# Patient Record
Sex: Male | Born: 1955 | Race: White | Hispanic: No | Marital: Married | Smoking: Never smoker
Health system: Southern US, Community
[De-identification: ages and names within clinical notes are randomized; demographics above are authoritative.]

## PROBLEM LIST (undated history)

## (undated) DIAGNOSIS — K589 Irritable bowel syndrome without diarrhea: Secondary | ICD-10-CM

## (undated) DIAGNOSIS — K8020 Calculus of gallbladder without cholecystitis without obstruction: Secondary | ICD-10-CM

## (undated) DIAGNOSIS — K5790 Diverticulosis of intestine, part unspecified, without perforation or abscess without bleeding: Secondary | ICD-10-CM

## (undated) DIAGNOSIS — I499 Cardiac arrhythmia, unspecified: Secondary | ICD-10-CM

## (undated) DIAGNOSIS — E782 Mixed hyperlipidemia: Secondary | ICD-10-CM

## (undated) DIAGNOSIS — E1169 Type 2 diabetes mellitus with other specified complication: Secondary | ICD-10-CM

## (undated) DIAGNOSIS — R197 Diarrhea, unspecified: Secondary | ICD-10-CM

## (undated) DIAGNOSIS — I471 Supraventricular tachycardia, unspecified: Secondary | ICD-10-CM

## (undated) DIAGNOSIS — N529 Male erectile dysfunction, unspecified: Secondary | ICD-10-CM

## (undated) DIAGNOSIS — F411 Generalized anxiety disorder: Secondary | ICD-10-CM

## (undated) DIAGNOSIS — K760 Fatty (change of) liver, not elsewhere classified: Secondary | ICD-10-CM

## (undated) DIAGNOSIS — G4700 Insomnia, unspecified: Secondary | ICD-10-CM

## (undated) DIAGNOSIS — T7840XA Allergy, unspecified, initial encounter: Secondary | ICD-10-CM

## (undated) DIAGNOSIS — H9319 Tinnitus, unspecified ear: Secondary | ICD-10-CM

## (undated) DIAGNOSIS — G4733 Obstructive sleep apnea (adult) (pediatric): Secondary | ICD-10-CM

## (undated) DIAGNOSIS — K2990 Gastroduodenitis, unspecified, without bleeding: Secondary | ICD-10-CM

## (undated) DIAGNOSIS — J301 Allergic rhinitis due to pollen: Secondary | ICD-10-CM

## (undated) DIAGNOSIS — E119 Type 2 diabetes mellitus without complications: Secondary | ICD-10-CM

## (undated) DIAGNOSIS — K449 Diaphragmatic hernia without obstruction or gangrene: Secondary | ICD-10-CM

## (undated) DIAGNOSIS — K219 Gastro-esophageal reflux disease without esophagitis: Secondary | ICD-10-CM

## (undated) DIAGNOSIS — Z8719 Personal history of other diseases of the digestive system: Secondary | ICD-10-CM

## (undated) DIAGNOSIS — M1990 Unspecified osteoarthritis, unspecified site: Secondary | ICD-10-CM

## (undated) DIAGNOSIS — K2970 Gastritis, unspecified, without bleeding: Secondary | ICD-10-CM

## (undated) DIAGNOSIS — K2270 Barrett's esophagus without dysplasia: Secondary | ICD-10-CM

## (undated) HISTORY — DX: Insomnia, unspecified: G47.00

## (undated) HISTORY — DX: Diaphragmatic hernia without obstruction or gangrene: K44.9

## (undated) HISTORY — DX: Allergy, unspecified, initial encounter: T78.40XA

## (undated) HISTORY — DX: Type 2 diabetes mellitus with other specified complication: E11.69

## (undated) HISTORY — DX: Obstructive sleep apnea (adult) (pediatric): G47.33

## (undated) HISTORY — DX: Supraventricular tachycardia: I47.1

## (undated) HISTORY — DX: Mixed hyperlipidemia: E78.2

## (undated) HISTORY — DX: Diverticulosis of intestine, part unspecified, without perforation or abscess without bleeding: K57.90

## (undated) HISTORY — DX: Fatty (change of) liver, not elsewhere classified: K76.0

## (undated) HISTORY — DX: Tinnitus, unspecified ear: H93.19

## (undated) HISTORY — PX: HERNIA REPAIR: SHX51

## (undated) HISTORY — PX: CARPAL TUNNEL RELEASE: SHX101

## (undated) HISTORY — DX: Diarrhea, unspecified: R19.7

## (undated) HISTORY — PX: UPPER GASTROINTESTINAL ENDOSCOPY: SHX188

## (undated) HISTORY — DX: Type 2 diabetes mellitus without complications: E11.9

## (undated) HISTORY — DX: Gastro-esophageal reflux disease without esophagitis: K21.9

## (undated) HISTORY — DX: Barrett's esophagus without dysplasia: K22.70

## (undated) HISTORY — PX: THORACIC DISC SURGERY: SHX801

## (undated) HISTORY — PX: ELBOW SURGERY: SHX618

## (undated) HISTORY — DX: Gastritis, unspecified, without bleeding: K29.70

## (undated) HISTORY — DX: Gastroduodenitis, unspecified, without bleeding: K29.90

## (undated) HISTORY — DX: Generalized anxiety disorder: F41.1

## (undated) HISTORY — DX: Allergic rhinitis due to pollen: J30.1

## (undated) HISTORY — DX: Male erectile dysfunction, unspecified: N52.9

## (undated) HISTORY — DX: Calculus of gallbladder without cholecystitis without obstruction: K80.20

## (undated) HISTORY — DX: Supraventricular tachycardia, unspecified: I47.10

## (undated) HISTORY — DX: Irritable bowel syndrome, unspecified: K58.9

---

## 1998-01-10 ENCOUNTER — Other Ambulatory Visit: Admission: RE | Admit: 1998-01-10 | Discharge: 1998-01-10 | Payer: Self-pay | Admitting: Gastroenterology

## 1999-05-09 ENCOUNTER — Ambulatory Visit: Admission: RE | Admit: 1999-05-09 | Discharge: 1999-05-09 | Payer: Self-pay | Admitting: Family Medicine

## 2000-01-11 ENCOUNTER — Ambulatory Visit (HOSPITAL_COMMUNITY): Admission: RE | Admit: 2000-01-11 | Discharge: 2000-01-11 | Payer: Self-pay | Admitting: Family Medicine

## 2000-01-11 ENCOUNTER — Encounter: Payer: Self-pay | Admitting: Family Medicine

## 2000-01-18 ENCOUNTER — Encounter (INDEPENDENT_AMBULATORY_CARE_PROVIDER_SITE_OTHER): Payer: Self-pay | Admitting: Specialist

## 2000-01-18 ENCOUNTER — Other Ambulatory Visit: Admission: RE | Admit: 2000-01-18 | Discharge: 2000-01-18 | Payer: Self-pay | Admitting: Gastroenterology

## 2000-08-19 ENCOUNTER — Encounter: Payer: Self-pay | Admitting: Family Medicine

## 2000-08-19 ENCOUNTER — Encounter: Admission: RE | Admit: 2000-08-19 | Discharge: 2000-08-19 | Payer: Self-pay | Admitting: Family Medicine

## 2005-01-23 ENCOUNTER — Other Ambulatory Visit: Payer: Self-pay

## 2005-01-23 ENCOUNTER — Emergency Department: Payer: Self-pay | Admitting: Emergency Medicine

## 2006-08-23 ENCOUNTER — Ambulatory Visit (HOSPITAL_COMMUNITY): Admission: RE | Admit: 2006-08-23 | Discharge: 2006-08-23 | Payer: Self-pay | Admitting: Cardiology

## 2007-07-11 ENCOUNTER — Ambulatory Visit: Payer: Self-pay | Admitting: Gastroenterology

## 2007-07-11 LAB — CONVERTED CEMR LAB
Eosinophils Absolute: 0.1 10*3/uL (ref 0.0–0.7)
Eosinophils Relative: 2 % (ref 0.0–5.0)
Folate: >20 ng/mL
Iron: 65 ug/dL (ref 42–165)
MCV: 87.8 fL (ref 78.0–100.0)
Neutrophils Relative %: 64.4 % (ref 43.0–77.0)
Platelets: 240 10*3/uL (ref 150–400)
RDW: 13.2 % (ref 11.5–14.6)
Sed Rate: 5 mm/hr (ref 0–16)
Vitamin B-12: 482 pg/mL (ref 211–911)
WBC: 7.3 10*3/uL (ref 4.5–10.5)

## 2007-07-12 ENCOUNTER — Encounter: Payer: Self-pay | Admitting: Gastroenterology

## 2007-07-19 ENCOUNTER — Ambulatory Visit: Payer: Self-pay | Admitting: Gastroenterology

## 2007-08-17 ENCOUNTER — Ambulatory Visit: Payer: Self-pay | Admitting: Gastroenterology

## 2007-08-17 DIAGNOSIS — K2270 Barrett's esophagus without dysplasia: Secondary | ICD-10-CM

## 2007-08-17 DIAGNOSIS — K5730 Diverticulosis of large intestine without perforation or abscess without bleeding: Secondary | ICD-10-CM | POA: Insufficient documentation

## 2007-08-17 DIAGNOSIS — K589 Irritable bowel syndrome without diarrhea: Secondary | ICD-10-CM | POA: Insufficient documentation

## 2010-03-02 DIAGNOSIS — K219 Gastro-esophageal reflux disease without esophagitis: Secondary | ICD-10-CM

## 2010-03-02 DIAGNOSIS — E119 Type 2 diabetes mellitus without complications: Secondary | ICD-10-CM | POA: Insufficient documentation

## 2010-03-03 ENCOUNTER — Ambulatory Visit: Payer: Self-pay | Admitting: Gastroenterology

## 2010-03-03 LAB — CONVERTED CEMR LAB
Albumin: 4.1 g/dL (ref 3.5–5.2)
BUN: 20 mg/dL (ref 6–23)
Basophils Relative: 0.2 % (ref 0.0–3.0)
Creatinine, Ser: 1 mg/dL (ref 0.4–1.5)
Eosinophils Absolute: 0.1 10*3/uL (ref 0.0–0.7)
Folate: 15.7 ng/mL
GFR calc non Af Amer: 78.94 mL/min (ref >60)
Glucose, Bld: 120 mg/dL — ABNORMAL HIGH (ref 70–99)
HCT: 45.3 % (ref 39.0–52.0)
Hemoglobin: 15.6 g/dL (ref 13.0–17.0)
IgA: 197 mg/dL (ref 68–378)
Iron: 91 ug/dL (ref 42–165)
Lipase: 57 units/L (ref 11.0–59.0)
Lymphs Abs: 2.4 10*3/uL (ref 0.7–4.0)
MCHC: 34.3 g/dL (ref 30.0–36.0)
MCV: 89.2 fL (ref 78.0–100.0)
Monocytes Absolute: 0.9 10*3/uL (ref 0.1–1.0)
Neutro Abs: 5.2 10*3/uL (ref 1.4–7.7)
Neutrophils Relative %: 60.6 % (ref 43.0–77.0)
RBC: 5.08 M/uL (ref 4.22–5.81)
Sed Rate: 4 mm/hr (ref 0–22)
TSH: 1.18 microintl units/mL (ref 0.35–5.50)
Tissue Transglutaminase Ab, IgA: 4.2 units (ref <20)
Total Bilirubin: 1 mg/dL (ref 0.3–1.2)
Vitamin B-12: 486 pg/mL (ref 211–911)

## 2010-03-09 ENCOUNTER — Encounter: Payer: Self-pay | Admitting: Gastroenterology

## 2010-03-24 ENCOUNTER — Ambulatory Visit: Payer: Self-pay | Admitting: Gastroenterology

## 2010-03-24 ENCOUNTER — Encounter (INDEPENDENT_AMBULATORY_CARE_PROVIDER_SITE_OTHER): Payer: Self-pay | Admitting: *Deleted

## 2010-03-24 DIAGNOSIS — I10 Essential (primary) hypertension: Secondary | ICD-10-CM

## 2010-03-24 DIAGNOSIS — K2990 Gastroduodenitis, unspecified, without bleeding: Secondary | ICD-10-CM

## 2010-03-24 DIAGNOSIS — R197 Diarrhea, unspecified: Secondary | ICD-10-CM | POA: Insufficient documentation

## 2010-03-24 DIAGNOSIS — K2970 Gastritis, unspecified, without bleeding: Secondary | ICD-10-CM | POA: Insufficient documentation

## 2010-03-24 DIAGNOSIS — K449 Diaphragmatic hernia without obstruction or gangrene: Secondary | ICD-10-CM | POA: Insufficient documentation

## 2010-03-24 DIAGNOSIS — E785 Hyperlipidemia, unspecified: Secondary | ICD-10-CM

## 2010-04-27 ENCOUNTER — Ambulatory Visit
Admission: RE | Admit: 2010-04-27 | Discharge: 2010-04-27 | Payer: Self-pay | Source: Home / Self Care | Attending: Gastroenterology | Admitting: Gastroenterology

## 2010-04-27 ENCOUNTER — Other Ambulatory Visit: Payer: Self-pay | Admitting: Gastroenterology

## 2010-04-28 LAB — HELICOBACTER PYLORI SCREEN-BIOPSY: UREASE: NEGATIVE

## 2010-04-29 LAB — GLUCOSE, CAPILLARY
Glucose-Capillary: 141 mg/dL — ABNORMAL HIGH (ref 70–99)
Glucose-Capillary: 128 mg/dL — ABNORMAL HIGH (ref 70–99)

## 2010-05-05 ENCOUNTER — Encounter: Payer: Self-pay | Admitting: Gastroenterology

## 2010-05-14 NOTE — Letter (Signed)
Summary: Diabetic Instructions  [facility]  [address] [place], [place] [address]   Phone: [phone]  Fax: [phone]    DAETON KLUTH 09-07-1955 MRN: [id number]   X   ORAL DIABETIC MEDICATION INSTRUCTIONS  The day before your procedure:   Take your diabetic pill as you do normally  The day of your procedure:   Do not take your diabetic pill    We will check your blood sugar levels during the admission process and again in Recovery before discharging you home

## 2010-05-14 NOTE — Letter (Signed)
Summary: [facility] Instructions  [facility]  [address] [place], [place] [address]   Phone: [phone]  Fax: [phone]       Alexander Chambers    06-01-1955    MRN: [id number]        Procedure Day /Date: Monday 04/27/2010     Arrival Time: 1pm     Procedure Time: 2pm     Location of Procedure:                    X  [facility] (4th Floor)   PREPARATION FOR COLONOSCOPY WITH MOVIPREP   Starting 5 days prior to your procedure 04/22/2010 do not eat nuts, seeds, popcorn, corn, beans, peas,  salads, or any raw vegetables.  Do not take any fiber supplements (e.g. Metamucil, Citrucel, and Benefiber).  THE DAY BEFORE YOUR PROCEDURE         Sunday 04/26/2010  1.  Drink clear liquids the entire day-NO SOLID FOOD  2.  Do not drink anything colored red or purple.  Avoid juices with pulp.  No orange juice.  3.  Drink at least 64 oz. (8 glasses) of fluid/clear liquids during the day to prevent dehydration and help the prep work efficiently.  CLEAR LIQUIDS INCLUDE: Water Jello Ice Popsicles Tea (sugar ok, no milk/cream) Powdered fruit flavored drinks Coffee (sugar ok, no milk/cream) Gatorade Juice: apple, white grape, white cranberry  Lemonade Clear bullion, consomm, broth Carbonated beverages (any kind) Strained chicken noodle soup Hard Candy                             4.  In the morning, mix first dose of MoviPrep solution:    Empty 1 Pouch A and 1 Pouch B into the disposable container    Add lukewarm drinking water to the top line of the container. Mix to dissolve    Refrigerate (mixed solution should be used within 24 hrs)  5.  Begin drinking the prep at 5:00 p.m. The MoviPrep container is divided by 4 marks.   Every 15 minutes drink the solution down to the next mark (approximately 8 oz) until the full liter is complete.   6.  Follow completed prep with 16 oz of clear liquid of your choice (Nothing red or purple).  Continue to drink clear  liquids until bedtime.  7.  Before going to bed, mix second dose of MoviPrep solution:    Empty 1 Pouch A and 1 Pouch B into the disposable container    Add lukewarm drinking water to the top line of the container. Mix to dissolve    Refrigerate  THE DAY OF YOUR PROCEDURE      Monday 04/27/2010  Beginning at 9:00am (5 hours before procedure):         1. Every 15 minutes, drink the solution down to the next mark (approx 8 oz) until the full liter is complete.  2. Follow completed prep with 16 oz. of clear liquid of your choice.    3. You may drink clear liquids until 12 (noon) (2 HOURS BEFORE PROCEDURE).   MEDICATION INSTRUCTIONS  Unless otherwise instructed, you should take regular prescription medications with a small sip of water   as early as possible the morning of your procedure.  Diabetic patients - see separate instructions.          OTHER INSTRUCTIONS  You will need a responsible adult at least 55  years of age to accompany you and drive you home.   This person must remain in the waiting room during your procedure.  Wear loose fitting clothing that is easily removed.  Leave jewelry and other valuables at home.  However, you may wish to bring a book to read or  an iPod/MP3 player to listen to music as you wait for your procedure to start.  Remove all body piercing jewelry and leave at home.  Total time from sign-in until discharge is approximately 2-3 hours.  You should go home directly after your procedure and rest.  You can resume normal activities the  day after your procedure.  The day of your procedure you should not:   Drive   Make legal decisions   Operate machinery   Drink alcohol   Return to work  You will receive specific instructions about eating, activities and medications before you leave.    The above instructions have been reviewed and explained to me by   _______________________    I fully understand and can verbalize these  instructions _____________________________ Date _________

## 2010-05-14 NOTE — Miscellaneous (Signed)
Summary: Orders Update  Clinical Lists Changes  Orders: Added new Test order of TLB-H Pylori Screen Gastric Biopsy (83013-CLOTEST) - Signed 

## 2010-05-14 NOTE — Assessment & Plan Note (Signed)
Summary: 3 WEEK FU /JMS   History of Present Illness Visit Type: Follow-up Visit Primary GI MD: Sheryn Bison MD FACP FAGA Primary Provider: Lupita Raider, MD  Requesting Provider: na Chief Complaint: F/u visit for IBS flare. Pt states that he is still having diarrhea  History of Present Illness:   Alexander Chambers continues with crampy lower abdominal pain, diarrhea, and no rectal bleeding. Treatment with Xifaxan and probiotics has not alleviated his 5-6 soft stools a day. His acid reflux symptoms are improved on omeprazole. He has had little improvement with Levsin, and apparently does not usually appear mild. His diabetes and hypertension are under good control.  He denies infectious disease exposure or sick family members at home, severe stress, other new medications, or other systemic complaints. All lab data was normal including sedimentation rate. As per before, he is status post fundoplication surgery.   GI Review of Systems      Denies abdominal pain, acid reflux, belching, bloating, chest pain, dysphagia with liquids, dysphagia with solids, heartburn, loss of appetite, nausea, vomiting, vomiting blood, weight loss, and  weight gain.      Reports diarrhea and  irritable bowel syndrome.     Denies anal fissure, black tarry stools, change in bowel habit, constipation, diverticulosis, fecal incontinence, heme positive stool, hemorrhoids, jaundice, light color stool, liver problems, rectal bleeding, and  rectal pain.    Current Medications (verified): 1)  Lisinopril 5 Mg Tabs (Lisinopril) .... Take 1 Tablet By Mouth Once Daily 2)  Metformin Hcl 500 Mg Tabs (Metformin Hcl) .... Take 1 Tablet  Three Times A Day 3)  Glipizide 10 Mg Tabs (Glipizide) .... Take 1 Tablet By Mouth Two Times A Day 4)  Allergy Relief 4 Mg Tabs (Chlorpheniramine Maleate) .... Take 1 Tablet By Mouth Once Daily 5)  Januvia 100 Mg Tabs (Sitagliptin Phosphate) .... Take 1 Tablet By Mouth Once Daily 6)  Vytorin 10-10 Mg  Tabs (Ezetimibe-Simvastatin) .... Once Daily 7)  Aspirin 81 Mg Tbec (Aspirin) .... Once Daily 8)  Omeprazole 20 Mg Cpdr (Omeprazole) .... Take One By Mouth Once Daily 9)  Levsin 0.125 Mg Tabs (Hyoscyamine Sulfate) .... Take One By Mouth Two Times A Day As Needed 10)  Anti-Diarrheal 2 Mg Tabs (Loperamide Hcl) .... As Needed  Allergies (verified): No Known Drug Allergies  Past History:  Past medical, surgical, family and social histories (including risk factors) reviewed for relevance to current acute and chronic problems.  Past Medical History: DIARRHEA (ICD-787.91) HYPERTENSION (ICD-401.9) HYPERLIPIDEMIA (ICD-272.4) HIATAL HERNIA (ICD-553.3) GASTRITIS (ICD-535.50) GERD (ICD-530.81) DM (ICD-250.00) DIVERTICULOSIS-COLON (ICD-562.10) IRRITABLE BOWEL SYNDROME (ICD-564.1) BARRETT'S ESOPHAGUS (ICD-530.85)  Past Surgical History: Reviewed history from 03/03/2010 and no changes required. cyst removed from right elbow Hernia Surgery  Family History: Reviewed history from 03/02/2010 and no changes required. No FH of Colon Cancer: Family History of Diabetes:  Family History of Heart Disease: father  Social History: Reviewed history from 03/02/2010 and no changes required. CKS Package Married Patient has never smoked.  Alcohol Use - yes socially Illicit Drug Use - no  Review of Systems       The patient complains of allergy/sinus, fatigue, and sleeping problems.  The patient denies anemia, anxiety-new, arthritis/joint pain, back pain, blood in urine, breast changes/lumps, change in vision, confusion, cough, coughing up blood, depression-new, fainting, fever, headaches-new, hearing problems, heart murmur, heart rhythm changes, itching, menstrual pain, muscle pains/cramps, night sweats, nosebleeds, pregnancy symptoms, shortness of breath, skin rash, sore throat, swelling of feet/legs, swollen lymph glands, thirst - excessive ,  urination - excessive , urination changes/pain, urine  leakage, vision changes, and voice change.    Vital Signs:  Patient profile:   55 year old male Height:      69 inches Weight:      182 pounds BMI:     26.97 BSA:     1.99 Pulse rate:   92 / minute Pulse rhythm:   regular BP sitting:   128 / 74  (left arm) Cuff size:   regular  Vitals Entered By: Ok Anis CMA (March 24, 2010 3:17 PM)  Physical Exam  General:  Well developed, well nourished, no acute distress.healthy appearing.   Head:  Normocephalic and atraumatic. Eyes:  PERRLA, no icterus.exam deferred to patient's ophthalmologist.   Lungs:  Clear throughout to auscultation. Heart:  Regular rate and rhythm; no murmurs, rubs,  or bruits. Abdomen:  Soft, nontender and nondistended. No masses, hepatosplenomegaly or hernias noted. Normal bowel sounds.Mild tenderness over a mobile sigmoid colon in the left lower quadrant area. There is no rebound tenderness. Rectal:  Normal exam.hemocult negative.   Prostate:  .normal size prostate.   Msk:  Symmetrical with no gross deformities. Normal posture. Extremities:  No clubbing, cyanosis, edema or deformities noted. Neurologic:  Alert and  oriented x4;  grossly normal neurologically.   Impression & Recommendations:  Problem # 1:  DIARRHEA (ICD-787.91) Assessment Unchanged Consider inflammatory bowel disease with his sigmoid colon tenderness, cramping, and new onset diarrhea. He had no benefit for treatment for bacterial overgrowth syndrome with Xifaxan and probiotics. Surprisingly, all of his labs were normal. I have placed him on Asacol 800 mg twice a day, Librax t.i.d., and will continue his omeprazole. We also placed him on a low fiber diet as tolerated. Outpatient endoscopy and colonoscopy have been scheduled as soon as possible. His symptomatology seemed disproportionate to objective findings suggesting possible functional problems.Will obtain small bowel biopsies the time of endoscopy to exclude celiac  disease. Orders: Colon/Endo (Colon/Endo)  Problem # 2:  GERD (ICD-530.81) Assessment: Improved Continue omeprazole 20 mg a day as tolerated. Orders: Colon/Endo (Colon/Endo)  Problem # 3:  DM (ICD-250.00) Assessment: Unchanged continue other medications per Dr. Clelia Croft.  Patient Instructions: 1)  Copy sent to : Lupita Raider, MD  2)  Your prescription(s) have been sent to you pharmacy.  3)  Stop your Levsin and start Librax. 4)  [facility] Patient Information Guide given to patient.  5)  Advised to stick with a low residue diet  avoiding food that can irritate bowel (see handout).  6)  Colonoscopy and Flexible Sigmoidoscopy brochure given.  7)  Upper Endoscopy brochure given.  8)  Your procedure has been scheduled for 04/27/2010, please follow the seperate instructions.  9)  The medication list was reviewed and reconciled.  All changed / newly prescribed medications were explained.  A complete medication list was provided to the patient / caregiver. Prescriptions: MOVIPREP 100 GM  SOLR (PEG-KCL-NACL-NASULF-NA ASC-C) As per prep instructions.  #1 x 0   Entered by:   Harlow Mares CMA (AAMA)   Authorized by:   Mardella Layman MD [facility]   Signed by:   Harlow Mares CMA (AAMA) on 03/24/2010   Method used:   Electronically to        [facility] [phone]* (retail)       [address]       [place], [place]  [address]  Ph: [phone] or [phone]       Fax: [phone]   RxID:   [id number] ASACOL HD 800 MG TBEC (MESALAMINE) take two tabs by mouth two times a day  #120 x 3   Entered by:   Harlow Mares CMA (AAMA)   Authorized by:   Mardella Layman MD [facility]   Signed by:   Harlow Mares CMA (AAMA) on 03/24/2010   Method used:   Electronically to        [facility] [phone]* (retail)       [address] [place], [place]  [address]       Ph: [phone] or [phone]       Fax: [phone]   RxID:    [id number] LIBRAX 2.5-5 MG CAPS (CLIDINIUM-CHLORDIAZEPOXIDE) take one by mouth three times a day before meals.  #90 x 3   Entered by:   Harlow Mares CMA (AAMA)   Authorized by:   Mardella Layman MD [facility]   Signed by:   Harlow Mares CMA (AAMA) on 03/24/2010   Method used:   Electronically to        [facility] [phone]* (retail)       [address] [place], [place]  [address]       Ph: [phone] or [phone]       Fax: [phone]   RxID:   [id number]

## 2010-05-14 NOTE — Procedures (Signed)
Summary: Colonoscopy   Colonoscopy  Procedure date:  07/20/2007  Findings:      Location:  [facility].    Colonoscopy  Procedure date:  07/20/2007  Findings:      [facility]:   [facility]   Patient Name: Alexander Chambers, Alexander Chambers MRN: [id number] Procedure Procedures: Colonoscopy CPT: 81191.  Personnel: Endoscopist: Vania Rea. Jarold Motto, MD.  Referred By: Desma Maxim, MD.  Exam Location: Exam performed in Outpatient Clinic. Outpatient  Patient Consent: Procedure, Alternatives, Risks and Benefits discussed, consent obtained, from patient. Consent was obtained by the RN.  Indications Symptoms: Diarrhea Patient's stools are liquid. Abdominal pain / bloating.  History  Current Medications: Patient is not currently taking Coumadin.  Pre-Exam Physical: Performed Jul 19, 2007. Entire physical exam was normal.  Comments: Pt. history reviewed/updated, physical exam performed prior to initiation of sedation? YES Exam Exam: Extent of exam reached: Terminal Ileum, extent intended: Terminal Ileum.  The cecum was identified by appendiceal orifice and IC valve. Patient position: on left side. Time to Cecum: 00:02:25. Time for Withdrawl: 00:05. Colon retroflexion performed. Images taken. ASA Classification: I. Tolerance: excellent.  Monitoring: Pulse and BP monitoring, Oximetry used. Supplemental O2 given. at 2 Liters.  Colon Prep Used Golytely for colon prep. Prep results: excellent.  Sedation Meds: Patient assessed and found to be appropriate for moderate (conscious) sedation. Fentanyl 50 mcg. given IV. Versed 7 mg. given IV.  Instrument(s): CF 140L. Serial [id number].  Findings - NORMAL EXAM: Ileum to Splenic Flexure. Not Seen: Polyps. Colitis. Crohn's.  - DIVERTICULOSIS: Descending Colon to Sigmoid Colon. Not bleeding. ICD9: Diverticulosis, Colon: 562.10.  - NORMAL EXAM: Sigmoid Colon to Rectum. AVM's. Tumors. Crohn's. Hemorrhoids.    Assessment  Diagnoses: 562.10: Diverticulosis, Colon.   Comments: NO EVIDENCE OF COLITIS..... Events  Unplanned Interventions: No intervention was required.  Plans Medication Plan: Continue current medications.  Patient Education: Patient given standard instructions for: Diverticulosis.  Disposition: After procedure patient sent to recovery. After recovery patient sent home.  Scheduling/Referral: Follow-Up prn.    CC: Desma Maxim, MD  This report was created from the original endoscopy report, which was reviewed and signed by the above listed endoscopist.

## 2010-05-14 NOTE — Procedures (Signed)
Summary: EGD   EGD  Procedure date:  07/19/2007  Findings:      Location: [facility]   Patient Name: Gadiel, John MRN: [id number] Procedure Procedures: Panendoscopy (EGD) CPT: 43235.    with biopsy(s)/brushing(s). CPT: D1846139.  Personnel: Endoscopist: Vania Rea. Jarold Motto, MD.  Referred By: Desma Maxim, MD.  Exam Location: Exam performed in Outpatient Clinic. Outpatient  Patient Consent: Procedure, Alternatives, Risks and Benefits discussed, consent obtained, from patient. Consent was obtained by the RN.  Indications  Surveillance of: Barrett's Esophagus.  Comments: PRIOR FUNDOPLICATION... History  Current Medications: Patient is not currently taking Coumadin.  Medical/Surgical History: Adult Onset Diabetes,  Pre-Exam Physical: Performed Jul 19, 2007  Entire physical exam was normal.  Comments: Pt. history reviewed/updated, physical exam performed prior to initiation of sedation? YES Exam Exam Info: Maximum depth of insertion Duodenum, intended Duodenum. Patient position: on left side. Duration of exam: 10 minutes. Vocal cords visualized. Gastric retroflexion performed. Images taken. ASA Classification: I. Tolerance: excellent.  Sedation Meds: Patient assessed and found to be appropriate for moderate (conscious) sedation. Fentanyl 25 mcg. given IV. Versed 2 mg. given IV.  Monitoring: BP and pulse monitoring done. Oximetry used. Supplemental O2 given at 2 Liters.  Instrument(s): GIF 160. Serial [id number].   Findings - Normal: Proximal Esophagus to Mid Esophagus. Not Seen: Tumor. Foreign body. Varices.  - BARRETT'S ESOPHAGUS: Length of Barrett's 4 cm. No inflammation present. Biopsy/Barrett's taken. ICD9: Barrett's: 530.2.  - Normal: Cardia to Body. Not Seen: Tumor. Ulcer. Mucosal abnormality.  - Normal: Antrum to Duodenal 2nd Portion. Ulcer. Mucosal abnormality.  - MUCOSAL ABNORMALITY: Body to Antrum. Erosions present.  Nodularity present. Red spots present. RUT done, results pending. ICD9: Gastritis, NSAID Induced: 535.40. Comment: CLO biopsy done for H. pylori exam...   Assessment  Diagnoses: 530.2: Barrett's. R/O dysplasia.  535.40: Gastritis, NSAID Induced.   Comments: PRIOR FUNDOPLICATION.... Events  Unplanned Intervention: No unplanned interventions were required.  Plans Instructions: No aspirin or non-steroidal containing medications: STOP NSAID.  Medication(s): Await pathology.  Patient Education: Patient given standard instructions for: Reflux. Mucosal Abnormality.  Disposition: After procedure patient sent to recovery. After recovery patient sent home.  Scheduling: Clinic Visit, to Vania Rea. Jarold Motto, MD, around Jun 18, 2007.    CC: Desma Maxim, MD  This report was created from the original endoscopy report, which was reviewed and signed by the above listed endoscopist.

## 2010-05-14 NOTE — Assessment & Plan Note (Signed)
Summary: IBS FLARE /YF   History of Present Illness Visit Type: Follow-up Visit Primary GI MD: Sheryn Bison MD FACP FAGA Primary Provider: Cam Hai, MD Chief Complaint: IBS flare x 2 months; hiatal hernia repair 17 years prior, symptoms are returning History of Present Illness:   55 year old Caucasian male with previous severe GERD that required fundoplication 17 years ago. He has done well until recently when he has had crampy lower, pain, diarrhea, and recurrence of his acid reflux symptoms. He denies rectal bleeding or systemic complaints. He has  known chronic IBS and is up-to-date on his colonoscopy. There's been no previous evidence of inflammatory bowel disease but he does have mild diverticulosis.  Medical problems include adult onset diabetes which is well managed with oral medications, and mild hypertension. He has no specific food intolerances, denies sorbitol-fructose use, and denies lactose intolerance. He does not abuse alcohol, but apparently does drink. He does not smoke and denies NSAID use. P.r.n. Imodium use seems to help his symptomatology.   GI Review of Systems    Reports acid reflux and  heartburn.      Denies abdominal pain, belching, bloating, chest pain, dysphagia with liquids, dysphagia with solids, loss of appetite, nausea, vomiting, vomiting blood, weight loss, and  weight gain.      Reports diarrhea, fecal incontinence, and  irritable bowel syndrome.     Denies anal fissure, black tarry stools, change in bowel habit, constipation, diverticulosis, heme positive stool, hemorrhoids, jaundice, light color stool, liver problems, rectal bleeding, and  rectal pain.    Current Medications (verified): 1)  Lisinopril 5 Mg Tabs (Lisinopril) .... Take 1 Tablet By Mouth Once Daily 2)  Metformin Hcl 500 Mg Tabs (Metformin Hcl) .... Take 1 Tablet  Three Times A Day 3)  Glipizide 10 Mg Tabs (Glipizide) .... Take 1 Tablet By Mouth Two Times A Day 4)  Allergy Relief 4 Mg  Tabs (Chlorpheniramine Maleate) .... Take 1 Tablet By Mouth Once Daily 5)  Januvia 100 Mg Tabs (Sitagliptin Phosphate) .... Take 1 Tablet By Mouth Once Daily 6)  Vytorin 10-10 Mg Tabs (Ezetimibe-Simvastatin) .... Once Daily 7)  Aspirin 81 Mg Tbec (Aspirin) .... Once Daily  Allergies (verified): No Known Drug Allergies  Past History:  Past medical, surgical, family and social histories (including risk factors) reviewed for relevance to current acute and chronic problems.  Past Medical History: Reviewed history from 03/02/2010 and no changes required. Current Problems:  GERD (ICD-530.81) DM (ICD-250.00) DIVERTICULOSIS-COLON (ICD-562.10) IRRITABLE BOWEL SYNDROME (ICD-564.1) BARRETT'S ESOPHAGUS (ICD-530.85)  Past Surgical History: cyst removed from right elbow Hernia Surgery  Family History: Reviewed history from 03/02/2010 and no changes required. No FH of Colon Cancer: Family History of Diabetes:  Family History of Heart Disease: father  Social History: Reviewed history from 03/02/2010 and no changes required. Patient has never smoked.  Alcohol Use - yes socially Illicit Drug Use - no  Review of Systems  The patient denies allergy/sinus, anemia, anxiety-new, arthritis/joint pain, back pain, blood in urine, breast changes/lumps, change in vision, confusion, cough, coughing up blood, depression-new, fainting, fatigue, fever, headaches-new, hearing problems, heart murmur, heart rhythm changes, itching, menstrual pain, muscle pains/cramps, night sweats, nosebleeds, pregnancy symptoms, shortness of breath, skin rash, sleeping problems, sore throat, swelling of feet/legs, swollen lymph glands, thirst - excessive , urination - excessive , urination changes/pain, urine leakage, vision changes, and voice change.    Vital Signs:  Patient profile:   55 year old male Height:      69 inches  Weight:      184.13 pounds BMI:     27.29 Pulse rate:   88 / minute Pulse rhythm:    regular BP sitting:   110 / 80  (left arm) Cuff size:   regular  Vitals Entered By: June McMurray CMA Duncan Dull) (March 03, 2010 3:14 PM)  Physical Exam  General:  Well developed, well nourished, no acute distress.healthy appearing.   Head:  Normocephalic and atraumatic. Eyes:  PERRLA, no icterus.exam deferred to patient's ophthalmologist.   Lungs:  Clear throughout to auscultation. Heart:  Regular rate and rhythm; no murmurs, rubs,  or bruits. Abdomen:  Soft, nontender and nondistended. No masses, hepatosplenomegaly or hernias noted. Normal bowel sounds.mild tenderness in the left lower quadrant noted. Rectal:  Normal exam.Formed stool which is guaiac negative. Prostate:  .normal size prostate.   Extremities:  No clubbing, cyanosis, edema or deformities noted. Neurologic:  Alert and  oriented x4;  grossly normal neurologically. Psych:  Alert and cooperative. Normal mood and affect.   Impression & Recommendations:  Problem # 1:  GERD (ICD-530.81) Assessment Deteriorated Reason for sudden acid reflux worsening is unclear, but not uncommon. I have restarted AcipHex 20 mg a day with standard antireflux restrictions. Screening labs have been ordered. Orders: TLB-CBC Platelet - w/Differential (85025-CBCD) TLB-BMP (Basic Metabolic Panel-BMET) (80048-METABOL) TLB-Hepatic/Liver Function Pnl (80076-HEPATIC) TLB-TSH (Thyroid Stimulating Hormone) (84443-TSH) TLB-B12, Serum-Total ONLY ([address]109-U04) TLB-Ferritin (82728-FER) TLB-Folic Acid (Folate) (82746-FOL) TLB-IBC Pnl (Iron/FE;Transferrin) (83550-IBC) TLB-Amylase (82150-AMYL) TLB-Lipase (83690-LIPASE) TLB-H. Pylori Abs(Helicobacter Pylori) (86677-HELICO) TLB-Sedimentation Rate (ESR) (85652-ESR) TLB-IgA (Immunoglobulin A) (82784-IGA) T-Sprue Panel (Celiac Disease Aby Eval) (83516x3/[address])  Problem # 2:  IRRITABLE BOWEL SYNDROME (ICD-564.1) Assessment: Deteriorated Diarrhea-predominant IBS with possible bacterial overgrowth  syndrome. I have decided to treat him with Xifaxan and 50 mg twice a day for [redacted] weeks along with probiotic therapy. P.r.n. sublingual labs and also prescribed period he may need followup colonoscopy exam. Fiber supplements also recommended as tolerated.   Orders: TLB-CBC Platelet - w/Differential (85025-CBCD) TLB-BMP (Basic Metabolic Panel-BMET) (80048-METABOL) TLB-Hepatic/Liver Function Pnl (80076-HEPATIC) TLB-TSH (Thyroid Stimulating Hormone) (84443-TSH) TLB-B12, Serum-Total ONLY (54098-J19) TLB-Ferritin (82728-FER) TLB-Folic Acid (Folate) (82746-FOL) TLB-IBC Pnl (Iron/FE;Transferrin) (83550-IBC) TLB-Amylase (82150-AMYL) TLB-Lipase (83690-LIPASE) TLB-H. Pylori Abs(Helicobacter Pylori) (86677-HELICO) TLB-Sedimentation Rate (ESR) (85652-ESR) TLB-IgA (Immunoglobulin A) (82784-IGA) T-Sprue Panel (Celiac Disease Aby Eval) (83516x3/[address])  Problem # 3:  BARRETT'S ESOPHAGUS (ICD-530.85) Assessment: Unchanged Actually he is due for followup endoscopy and dysplasia screening which we will discuss with his next clinic visit.  Patient Instructions: 1)  Copy sent to : Cam Hai, MD 2)  Your prescription(s) have been sent to you pharmacy.  3)  Please go to the basement today for your labs.  4)  Please schedule a follow-up appointment in 3 weeks.  5)  The medication list was reviewed and reconciled.  All changed / newly prescribed medications were explained.  A complete medication list was provided to the patient / caregiver. Prescriptions: ACIPHEX 20 MG TBEC (RABEPRAZOLE SODIUM) take one by mouth once daily  #30 x 3   Entered by:   Harlow Mares CMA (AAMA)   Authorized by:   Mardella Layman MD [facility]   Signed by:   Harlow Mares CMA (AAMA) on 03/03/2010   Method used:   [facility] [phone]* (retail)       [address]       [place], [place]  [address]  Ph: [phone] or [phone]       Fax: [phone]   RxID:    [id number] XIFAXAN 550 MG TABS (RIFAXIMIN) take one by mouth two times a day for 14 days  #28 x 0   Entered by:   Harlow Mares CMA (AAMA)   Authorized by:   Mardella Layman MD [facility]   Signed by:   Harlow Mares CMA (AAMA) on 03/03/2010   Method used:   Electronically to        [facility] [phone]* (retail)       [address] [place], [place]  [address]       Ph: [phone] or [phone]       Fax: [phone]   RxID:   [id number] LEVSIN 0.125 MG TABS (HYOSCYAMINE SULFATE) take one by mouth two times a day as needed  #60 x 3   Entered by:   Harlow Mares CMA (AAMA)   Authorized by:   Mardella Layman MD [facility]   Signed by:   Harlow Mares CMA (AAMA) on 03/03/2010   Method used:   Electronically to        [facility] [phone]* (retail)       [address] [place], [place]  [address]       Ph: [phone] or [phone]       Fax: [phone]   RxID:   [id number]

## 2010-05-14 NOTE — Procedures (Addendum)
Summary: Upper Endoscopy  Patient: [name] Note: All result statuses are Final unless otherwise noted.  Tests: (1) Upper Endoscopy (EGD)   EGD Upper Endoscopy       DONE     [facility]     [address]     [place], [place]  [address]           ENDOSCOPY PROCEDURE REPORT           PATIENT:  Alexander Chambers, Alexander Chambers  MR#:  [id number]     BIRTHDATE:  05/24/1955, 54 yrs. old  GENDER:  male           ENDOSCOPIST:  Vania Rea. Jarold Motto, MD, [facility]     Referred by:  Lupita Raider, M.D.           PROCEDURE DATE:  04/27/2010     PROCEDURE:  EGD with biopsy, [address]     ASA CLASS:  Class II     INDICATIONS:  small bowel biopsy to rule out celiac sprue, GERD,     Screening for Barrett's esopahgus in a GERD patient., diarrhea           MEDICATIONS:   There was residual sedation effect present from     prior procedure., Fentanyl 25 mcg IV, Versed 1 mg IV     TOPICAL ANESTHETIC:           DESCRIPTION OF PROCEDURE:   After the risks benefits and     alternatives of the procedure were thoroughly explained, informed     consent was obtained.  The LB [id number]-H180 [id number] endoscope was     introduced through the mouth and advanced to the second portion of     the duodenum, without limitations.  The instrument was slowly     withdrawn as the mucosa was fully examined.     <<PROCEDUREIMAGES>>           Moderate gastritis was found in the antrum. EROSIVE GASTRITIS.CLO     AND REGULAR BIOPSIES DONE.  s/p fundoplication.  Normal duodenal     folds were noted. SI BX. DONE.  Barrett's esophagus was found. 5     CM SEGMENT OF BARRETT'S MUCOSA BIOPSIED.    Retroflexed views     revealed no abnormalities.    The scope was then withdrawn from     the patient and the procedure completed.           COMPLICATIONS:  None           ENDOSCOPIC IMPRESSION:     1) Moderate gastritis in the antrum     2) S/p fundoplication     3) Normal duodenal folds     4) Barrett's esophagus     1.R/O H.PYLORI  2.R/O CELIAC DISEASE     3.R/O DYSPLASIA     4.PRIOR FUNDOPLICATION     RECOMMENDATIONS:     1) Await biopsy results     2) REPEAT SURVEILLANCE EGD IN 3 YEARS     3) Rx CLO if positive     4) continue PPI           REPEAT EXAM:  No           ______________________________     Vania Rea. Jarold Motto, MD, [facility]           CC:  Lupita Raider, M.D.           n.     eSIGNED:   Vania Rea. Jarold Motto at  04/27/2010 02:48 PM           Vivianne Spence, [id number]  Note: An exclamation mark (!) indicates a result that was not dispersed into the flowsheet. Document Creation Date: 04/27/2010 2:48 PM _______________________________________________________________________  (1) Order result status: Final Collection or observation date-time: 04/27/2010 14:38 Requested date-time:  Receipt date-time:  Reported date-time:  Referring Physician:   Ordering Physician: Sheryn Bison [phone]) Specimen Source:  Source: Launa Grill Order Number: [id number] Lab site:

## 2010-05-14 NOTE — Miscellaneous (Signed)
Summary: Change PPI  Clinical Lists Changes  Medications: Changed medication from ACIPHEX 20 MG TBEC (RABEPRAZOLE SODIUM) take one by mouth once daily to OMEPRAZOLE 40 MG CPDR (OMEPRAZOLE) take one by mouth once daily - Signed Rx of OMEPRAZOLE 40 MG CPDR (OMEPRAZOLE) take one by mouth once daily;  #30 x 6;  Signed;  Entered by: Harlow Mares CMA (AAMA);  Authorized by: Mardella Layman MD [facility];  Method used: [facility] to [facility] [phone]*, [address], [place], [place], [place]  [address], Ph: [phone] or [phone], Fax: [phone]  patients insurance requires him to try and fail omeprazole before they will pay for Aciphex.   Prescriptions: OMEPRAZOLE 40 MG CPDR (OMEPRAZOLE) take one by mouth once daily  #30 x 6   Entered by:   Harlow Mares CMA (AAMA)   Authorized by:   Mardella Layman MD [facility]   Signed by:   Harlow Mares CMA (AAMA) on 03/09/2010   Method used:   Electronically to        [facility] [phone]* (retail)       [address] [place], [place]  [address]       Ph: [phone] or [phone]       Fax: [phone]   RxID:   [id number]   Appended Document: Change PPI    Clinical Lists Changes  Medications: Changed medication from OMEPRAZOLE 40 MG CPDR (OMEPRAZOLE) take one by mouth once daily to OMEPRAZOLE 20 MG CPDR (OMEPRAZOLE) take one by mouth once daily - Signed Rx of OMEPRAZOLE 20 MG CPDR (OMEPRAZOLE) take one by mouth once daily;  #30 x 3;  Signed;  Entered by: Harlow Mares CMA (AAMA);  Authorized by: Mardella Layman MD [facility];  Method used: Electronically to [facility] [phone]*, [address] [place], [place], [place]  [address], Ph: [phone] or [phone], Fax: [phone]    Prescriptions: OMEPRAZOLE 20 MG CPDR (OMEPRAZOLE) take one by mouth once daily  #30 x 3   Entered by:   Harlow Mares CMA (AAMA)   Authorized by:   Mardella Layman MD [facility]   Signed by:   Harlow Mares CMA (AAMA) on  03/10/2010   Method used:   Electronically to        [facility] [phone]* (retail)       [address] [place], [place]  [address]       Ph: [phone] or [phone]       Fax: [phone]   RxID:   [id number]

## 2010-05-14 NOTE — Letter (Signed)
Summary: Patient Notice- Colon Biospy Results  [facility]  [address] [place], [place] [address]   Phone: [phone]  Fax: [phone]        May 05, 2010 MRN: [id number]    Alexander Chambers [address] [facility] [address] [place], [place]  [address]    Dear [name],  I am pleased to inform you that the biopsies taken during your recent colonoscopy did not show any evidence of cancer upon pathologic examination.  Additional information/recommendations:  __No further action is needed at this time.  Please follow-up with      your primary care physician for your other healthcare needs.  __Please call [phone] to schedule a return visit to review      your condition.  x__Continue with the treatment plan as outlined on the day of your      exam.  __You should have a repeat colonoscopy examination for this problem           in _ years.  Please call us if you are having persistent problems or have questions about your condition that have not been fully answered at this time.  Sincerely,  Mardella Layman MD [facility]   This letter has been electronically signed by your physician.  Appended Document: Patient Notice- Colon Biospy Results Letter mailed

## 2010-05-14 NOTE — Procedures (Addendum)
Summary: Colonoscopy  Patient: Alexander Chambers Note: All result statuses are Final unless otherwise noted.  Tests: (1) Colonoscopy (COL)   COL Colonoscopy           DONE     [facility]     [address]     [place], [place]  [address]           COLONOSCOPY PROCEDURE REPORT           PATIENT:  Alexander Chambers, Alexander Chambers  MR#:  [id number]     BIRTHDATE:  22-Dec-1955, 54 yrs. old  GENDER:  male     ENDOSCOPIST:  Vania Rea. Jarold Motto, MD, [facility]     REF. BY:  Lupita Raider, M.D.     PROCEDURE DATE:  04/27/2010     PROCEDURE:  Colonoscopy with biopsy     ASA CLASS:  Class II     INDICATIONS:  Abdominal pain, unexplained diarrhea, surveillance           MEDICATIONS:   Fentanyl 75 mcg IV, Versed 9 mg IV           DESCRIPTION OF PROCEDURE:   After the risks benefits and     alternatives of the procedure were thoroughly explained, informed     consent was obtained.  Digital rectal exam was performed and     revealed no abnormalities.   The LB 180AL [id number] endoscope was     introduced through the anus and advanced to the cecum, which was     identified by both the appendix and ileocecal valve, without     limitations.  The quality of the prep was excellent, using     MoviPrep.  The instrument was then slowly withdrawn as the colon     was fully examined.     <<PROCEDUREIMAGES>>           FINDINGS:  Mild diverticulosis was found in the sigmoid to     descending colon segments.  No polyps or cancers were seen. RANDOM     COLON BIOPSIES DONE.  no active bleeding or blood in c.     Retroflexed views in the rectum revealed no abnormalities.    The     scope was then withdrawn from the patient and the procedure     completed.           COMPLICATIONS:  None     ENDOSCOPIC IMPRESSION:     1) Mild diverticulosis in the sigmoid to descending colon     segments     2) No polyps or cancers     3) No active bleeding or blood in c     R/O MICROSCOPIC/COLLAGENOUS COLITIS.     RECOMMENDATIONS:  1) Await biopsy results     2) Repeat Colonoscopy in 5 years.     EGD     REPEAT EXAM:  No           ______________________________     Vania Rea. Jarold Motto, MD, [facility]           CC:  Lupita Raider, M.D.           n.     eSIGNED:   Vania Rea. Gahel Safley at 04/27/2010 02:39 PM           Vivianne Spence, [id number]  Note: An exclamation mark (!) indicates a result that was not dispersed into the flowsheet. Document Creation Date: 04/27/2010 2:39 PM _______________________________________________________________________  (1) Order result status: Final Collection or observation  date-time: 04/27/2010 14:26 Requested date-time:  Receipt date-time:  Reported date-time:  Referring Physician:   Ordering Physician: Sheryn Bison [phone]) Specimen Source:  Source: Launa Grill Order Number: [id number] Lab site:   Appended Document: Colonoscopy     Procedures Next Due Date:    Colonoscopy: 04/2015

## 2010-05-14 NOTE — Letter (Signed)
Summary: Patient [facility] Biopsy Results   [facility]  [address] [place], [place] [address]   Phone: [phone]  Fax: [phone]        May 05, 2010 MRN: [id number]    Alexander Chambers [address] [facility] [address] [place], [place]  [address]    Dear Mr. Kaigler,  I am pleased to inform you that the biopsies taken during your recent endoscopic examination did not show any evidence of cancer upon pathologic examination.  Additional information/recommendations:  __No further action is needed at this time.  Please follow-up with      your primary care physician for your other healthcare needs.  xx__ Please call [phone] to schedule a return visit to review      your condition.  _x_ Continue with the treatment plan as outlined on the day of your      exam.  __ You should have a repeat endoscopic examination for this problem              in _ months/years.   Please call us if you are having persistent problems or have questions about your condition that have not been fully answered at this time.  Sincerely,  Mardella Layman MD [facility]  This letter has been electronically signed by your physician.  Appended Document: Patient Notice-Endo Biopsy Results Letter mailed

## 2010-05-19 ENCOUNTER — Encounter: Payer: Self-pay | Admitting: Gastroenterology

## 2010-05-19 ENCOUNTER — Ambulatory Visit (INDEPENDENT_AMBULATORY_CARE_PROVIDER_SITE_OTHER): Payer: Commercial Managed Care - PPO | Admitting: Gastroenterology

## 2010-05-19 DIAGNOSIS — K219 Gastro-esophageal reflux disease without esophagitis: Secondary | ICD-10-CM

## 2010-05-19 DIAGNOSIS — K902 Blind loop syndrome, not elsewhere classified: Secondary | ICD-10-CM

## 2010-05-28 NOTE — Assessment & Plan Note (Signed)
Summary: PROCEDURE F-UP/RECEIVED LETTER STATING HE NEEDS APPT/SCHED W-...   Vital Signs:  Patient profile:   55 year old male Height:      69 inches Weight:      184.38 pounds BMI:     27.33 Pulse rate:   72 / minute Pulse rhythm:   regular BP sitting:   116 / 80  (right arm) Cuff size:   regular  Vitals Entered By: June McMurray CMA Duncan Dull) (May 19, 2010 10:24 AM)  History of Present Illness Visit Type: Follow-up Visit Primary GI MD: Sheryn Bison MD FACP FAGA Primary Provider: Lupita Raider, MD  Requesting Provider: na Chief Complaint: Ibs Flare started yesterday, post procedure History of Present Illness:   Abdominal Gas, bloating, and 4-5 loose bowel movements a day continues with only mild improvement with Asacol 800 mg 2 tablets a day, p.r.n. Librax, and omeprazole 20 mg a day for his chronic GERD. He has adult onset diabetes but is not insulin-dependent. These GI complaints have been present now for several months without any definite diagnosis. Celiac serologies and small bowel biopsy was negative. Also examination for H. pylori was negative.Endoscopic esophageal biopsies confirmed Barrett's mucosa without dysplasia. Patient is status post fundoplication.Review of his records does show the same symptoms going back 3-4 years.   GI Review of Systems    Reports abdominal pain, acid reflux, and  bloating.      Denies belching, chest pain, dysphagia with liquids, dysphagia with solids, heartburn, loss of appetite, nausea, vomiting, vomiting blood, weight loss, and  weight gain.      Reports diarrhea and  irritable bowel syndrome.     Denies anal fissure, black tarry stools, change in bowel habit, constipation, diverticulosis, fecal incontinence, heme positive stool, hemorrhoids, jaundice, light color stool, liver problems, rectal bleeding, and  rectal pain.  Current Medications (verified): 1)  Lisinopril 5 Mg Tabs (Lisinopril) .... Take 1 Tablet By Mouth Once Daily 2)   Metformin Hcl 500 Mg Tabs (Metformin Hcl) .... Take 1 Tablet  Three Times A Day 3)  Glipizide 10 Mg Tabs (Glipizide) .... Take 1 Tablet By Mouth Two Times A Day 4)  Allergy Relief 4 Mg Tabs (Chlorpheniramine Maleate) .... Take 1 Tablet By Mouth Once Daily 5)  Januvia 100 Mg Tabs (Sitagliptin Phosphate) .... Take 1 Tablet By Mouth Once Daily 6)  Vytorin 10-10 Mg Tabs (Ezetimibe-Simvastatin) .... Once Daily 7)  Aspirin 81 Mg Tbec (Aspirin) .... Once Daily 8)  Omeprazole 20 Mg Cpdr (Omeprazole) .... Take One By Mouth Once Daily 9)  Librax 2.5-5 Mg Caps (Clidinium-Chlordiazepoxide) .... Take One By Mouth Three Times A Day Before Meals. 10)  Anti-Diarrheal 2 Mg Tabs (Loperamide Hcl) .... As Needed 11)  Asacol Hd 800 Mg Tbec (Mesalamine) .... Take Two Tabs By Mouth Two Times A Day  Allergies (verified): No Known Drug Allergies  Past History:  Past Medical History: Reviewed history from 03/24/2010 and no changes required. DIARRHEA (ICD-787.91) HYPERTENSION (ICD-401.9) HYPERLIPIDEMIA (ICD-272.4) HIATAL HERNIA (ICD-553.3) GASTRITIS (ICD-535.50) GERD (ICD-530.81) DM (ICD-250.00) DIVERTICULOSIS-COLON (ICD-562.10) IRRITABLE BOWEL SYNDROME (ICD-564.1) BARRETT'S ESOPHAGUS (ICD-530.85)  Past Surgical History: Reviewed history from 03/03/2010 and no changes required. cyst removed from right elbow Hernia Surgery  Family History: Reviewed history from 03/02/2010 and no changes required. No FH of Colon Cancer: Family History of Diabetes:  Family History of Heart Disease: father  Social History: Reviewed history from 03/24/2010 and no changes required. CKS Package Married Patient has never smoked.  Alcohol Use - yes socially Illicit  Drug Use - no  Review of Systems  The patient denies allergy/sinus, anemia, anxiety-new, arthritis/joint pain, back pain, blood in urine, breast changes/lumps, change in vision, confusion, cough, coughing up blood, depression-new, fainting, fatigue, fever,  headaches-new, hearing problems, heart murmur, heart rhythm changes, itching, menstrual pain, muscle pains/cramps, night sweats, nosebleeds, pregnancy symptoms, shortness of breath, skin rash, sleeping problems, sore throat, swelling of feet/legs, swollen lymph glands, thirst - excessive , urination - excessive , urination changes/pain, urine leakage, vision changes, and voice change.    Physical Exam  General:  Well developed, well nourished, no acute distress.healthy appearing.   Head:  Normocephalic and atraumatic. Eyes:  PERRLA, no icterus.exam deferred to patient's ophthalmologist.   Abdomen:  Soft, nontender and nondistended. No masses, hepatosplenomegaly or hernias noted. Normal bowel sounds. Psych:  Alert and cooperative. Normal mood and affect.   Impression & Recommendations:  Problem # 1:  DIARRHEA (ICD-787.91) Assessment Unchanged Diarrhea-predominant IBS with possible element of bacterial overgrowth syndrome. We will try Xifaxan 550 mg twice a day for 10 days with probiotic therapy. He is to continue other medications as previously outlined.  Problem # 2:  GERD (ICD-530.81) Assessment: Improved Continue omeprazole 20 mg a day. Endoscopy is performed every 3 years per his Barrett's mucosa.  Problem # 3:  DM (ICD-250.00) Assessment: Improved continue other medications as per Dr. Lupita Raider.  Patient Instructions: 1)  Copy sent to : Lupita Raider, MD  2)  Your prescription(s) have been sent to you pharmacy.  3)  Please schedule a follow-up appointment in 1 month.  4)  Please continue current medications.  5)  The medication list was reviewed and reconciled.  All changed / newly prescribed medications were explained.  A complete medication list was provided to the patient / caregiver. Prescriptions: XIFAXAN 550 MG TABS (RIFAXIMIN) take one by mouth two times a day for 10 days  #20 x 0   Entered by:   Harlow Mares CMA (AAMA)   Authorized by:   Mardella Layman MD  [facility]   Signed by:   Mardella Layman MD [facility] on 05/19/2010   Method used:   Electronically to        [facility] [facility] [employer] [phone]* (retail)       [address] [place], [place]  [address]       Ph: [phone] or [phone]       Fax: [phone]   RxID:   [id number]

## 2010-06-18 ENCOUNTER — Ambulatory Visit (INDEPENDENT_AMBULATORY_CARE_PROVIDER_SITE_OTHER): Payer: Commercial Managed Care - PPO | Admitting: Gastroenterology

## 2010-06-18 ENCOUNTER — Encounter: Payer: Self-pay | Admitting: Gastroenterology

## 2010-06-18 DIAGNOSIS — R197 Diarrhea, unspecified: Secondary | ICD-10-CM

## 2010-06-18 DIAGNOSIS — K589 Irritable bowel syndrome without diarrhea: Secondary | ICD-10-CM

## 2010-06-23 NOTE — Assessment & Plan Note (Signed)
Summary: 1 Month FU   History of Present Illness Visit Type: Follow-up Visit Primary GI MD: Sheryn Bison MD FACP FAGA Primary Provider: Lupita Raider, MD  Requesting Provider: na Chief Complaint: episodes of loose bowels, gas and bloating History of Present Illness:   Patient continues with gas, bloating, and urgent diarrhea despite all attempts at therapy including oral amino salicylates, treatment for bacterial overgrowth syndrome, and anti-spasmodic. Recent colonoscopy and endoscopy were unremarkable except for a short segment of Barrett's mucosa in his esophagus. He is status post fundoplication. Evaluation for celiac disease also has been negative. He denies melena, hematochezia, anorexia, or systemic complaints.   GI Review of Systems    Reports abdominal pain, bloating, and  nausea.     Location of  Abdominal pain: lower abdomen.    Denies acid reflux, belching, chest pain, dysphagia with liquids, dysphagia with solids, heartburn, loss of appetite, vomiting, vomiting blood, weight loss, and  weight gain.      Reports diarrhea.     Denies anal fissure, black tarry stools, change in bowel habit, constipation, diverticulosis, fecal incontinence, heme positive stool, hemorrhoids, irritable bowel syndrome, jaundice, light color stool, liver problems, rectal bleeding, and  rectal pain.    Current Medications (verified): 1)  Lisinopril 5 Mg Tabs (Lisinopril) .... Take 1 Tablet By Mouth Once Daily 2)  Metformin Hcl 500 Mg Tabs (Metformin Hcl) .... Take 1 Tablet  Three Times A Day 3)  Glipizide 10 Mg Tabs (Glipizide) .... Take 1 Tablet By Mouth Two Times A Day 4)  Allergy Relief 4 Mg Tabs (Chlorpheniramine Maleate) .... Take 1 Tablet By Mouth Once Daily 5)  Januvia 100 Mg Tabs (Sitagliptin Phosphate) .... Take 1 Tablet By Mouth Once Daily 6)  Vytorin 10-10 Mg Tabs (Ezetimibe-Simvastatin) .... Once Daily 7)  Aspirin 81 Mg Tbec (Aspirin) .... Once Daily 8)  Librax 2.5-5 Mg Caps  (Clidinium-Chlordiazepoxide) .... Take One By Mouth Three Times A Day Before Meals. 9)  Anti-Diarrheal 2 Mg Tabs (Loperamide Hcl) .... As Needed 10)  Asacol Hd 800 Mg Tbec (Mesalamine) .... Take Two Tabs By Mouth Two Times A Day 11)  Xifaxan 550 Mg Tabs (Rifaximin) .... Take One By Mouth Two Times A Day For 10 Days 12)  Align  Caps (Probiotic Product) .... Take One By Mouth Once Daily  Allergies (verified): No Known Drug Allergies  Past History:  Past medical, surgical, family and social histories (including risk factors) reviewed for relevance to current acute and chronic problems.  Past Medical History: Reviewed history from 03/24/2010 and no changes required. DIARRHEA (ICD-787.91) HYPERTENSION (ICD-401.9) HYPERLIPIDEMIA (ICD-272.4) HIATAL HERNIA (ICD-553.3) GASTRITIS (ICD-535.50) GERD (ICD-530.81) DM (ICD-250.00) DIVERTICULOSIS-COLON (ICD-562.10) IRRITABLE BOWEL SYNDROME (ICD-564.1) BARRETT'S ESOPHAGUS (ICD-530.85)  Past Surgical History: Reviewed history from 03/03/2010 and no changes required. cyst removed from right elbow Hernia Surgery  Family History: Reviewed history from 03/02/2010 and no changes required. No FH of Colon Cancer: Family History of Diabetes:  Family History of Heart Disease: father  Social History: Reviewed history from 03/24/2010 and no changes required. CKS Package Married Patient has never smoked.  Alcohol Use - yes socially Illicit Drug Use - no  Review of Systems       The patient complains of fatigue.  The patient denies allergy/sinus, anemia, anxiety-new, arthritis/joint pain, back pain, blood in urine, breast changes/lumps, change in vision, confusion, cough, coughing up blood, depression-new, fainting, fever, headaches-new, hearing problems, heart murmur, heart rhythm changes, itching, menstrual pain, muscle pains/cramps, night sweats, nosebleeds, pregnancy symptoms, shortness of  breath, skin rash, sleeping problems, sore throat,  swelling of feet/legs, swollen lymph glands, thirst - excessive , urination - excessive , urination changes/pain, urine leakage, vision changes, and voice change.    Vital Signs:  Patient profile:   55 year old male Height:      69 inches Weight:      181.13 pounds BMI:     26.84 Pulse rate:   72 / minute Pulse rhythm:   regular BP sitting:   110 / 72  (left arm) Cuff size:   regular  Vitals Entered By: June McMurray CMA Duncan Dull) (June 18, 2010 10:04 AM)  Physical Exam  General:  Well developed, well nourished, no acute distress.healthy appearing.   Head:  Normocephalic and atraumatic. Eyes:  PERRLA, no icterus.exam deferred to patient's ophthalmologist.   Psych:  Alert and cooperative. Normal mood and affect.   Impression & Recommendations:  Problem # 1:  DIARRHEA (ICD-787.91) Assessment Unchanged extremity complex workup which has been entirely negative. I think this patient has diarrhea predominant IBS and I have started him on Lotronex 0.5 mg once a day to be increased to twice a day if needed. He is to stop probiotics, Asacol, Librax, and Imodium which apparently causes him constipation. He is to call us should he develop constipation or bowel pain. Otherwise she is to continue his diabetic and antihypertensive medications as previously outlined. He will give [place] a progress call in 2 weeks time. Again reviewed dietary restriction with him and asked him to avoid sorbitol, fructose, and other non-digestible carbohydrates.  Problem # 2:  HYPERTENSION (ICD-401.9) Assessment: Improved blood pressure 110/72, and he's been advised to continue other medications per primary care including lisinopril 5 mg a day.  Problem # 3:  GERD (ICD-530.81) Assessment: Improved he is asymptomatic in terms of acid reflux symptoms, status post fundoplication. He will need Barrett's screening every 3-5 years.  Problem # 4:  DM (ICD-250.00) Assessment: Improved Continue diabetic medications per Dr.  Evlyn Kanner  Patient Instructions: 1)  Copy sent to : Lupita Raider, MD  2)  Stop Align, Xifaxan, Librax, Asacol. 3)  Lotronex patient contract reviewed and signed by patient and physician.  4)  The medication list was reviewed and reconciled.  All changed / newly prescribed medications were explained.  A complete medication list was provided to the patient / caregiver. 5)  Please call our GI Office back in 2 weeks with a follow-up of symptoms. 6)  IBS brochure given.  7)  Lotronex patient contract reviewed and signed by patient and physician.  Prescriptions: LOTRONEX 0.5 MG TABS (ALOSETRON HCL) take one by mouth once daily  #30 x 0   Entered by:   Harlow Mares CMA (AAMA)   Authorized by:   Mardella Layman MD [facility]   Signed by:   Harlow Mares CMA (AAMA) on 06/18/2010   Method used:   Print then Give to Patient   RxID:   [id number]

## 2010-06-24 ENCOUNTER — Telehealth: Payer: Self-pay | Admitting: Gastroenterology

## 2010-06-30 NOTE — Progress Notes (Signed)
Summary: Speak to nurse  Phone Note Call from Patient Call back at [phone] cell   Call For: Dr Jarold Motto Summary of Call: Lotrenex causing complications.  Initial call taken by: Leanor Kail [facility],  June 24, 2010 9:30 AM  Follow-up for Phone Call        Patient with hx of IBS, Barrett's and s/p Fundoplication. Saw Dr Jarold Motto on 05/19/10 with episode of IBS and given Xifaxan with return visit on 06/18/10. On that visit c/o bloating, gas, urgent diarrhea. Patient started on Lotrenex daily and could increase to two times a day if needed; he is taking drug once daily. He was to stop Asacol, Librax, Imodium and Probbiotic, but patient denies stopping  the Probiotic.  Today, patient reports no BM since Saturday, headaches, some nausea. He reports discomfort but no pain- he denies a fever. He stated he's on a regular diet, altho Dr Jarold Motto instructed him to avoid sorbitol, fructose, and non digestible carbs. Please advise. Thanks.Graciella Freer RN  June 24, 2010 10:58 AM  Follow-up by: Graciella Freer RN,  June 24, 2010 10:58 AM  Additional Follow-up for Phone Call Additional follow up Details #1::        OK,IS HE TAKING THE XIFAXAN? HE CAN STAY ON THE PROBIOTIC,STOP THE LOTRONEX ,AND COMPLETE THE COURSE OF Tiburcio Pea ASK HIM TO CALL BACK AND LET us KNOW HOW HE FEELS. HE SHOULD AVOID ARTIFICIAL SWEETNERS Additional Follow-up by: Peterson Ao,  June 24, 2010 11:31 AM    Additional Follow-up for Phone Call Additional follow up Details #2::    No, he finished the Xifaxan in February and on the 2 week f/u he was given the Lotrenex. What can he do for the constipation. Thanks.Graciella Freer RN  June 24, 2010 11:59 AM.  Additional Follow-up for Phone Call Additional follow up Details #3:: Details for Additional Follow-up Action Taken: I WOULD NOT DO ANYTHING. HE WAS GIVEN LOTRONEX FOR DIARRHEA, WHEN HE STOPS THE LOTRONEX HIS BOWEL WILL START WORKING AGAIN-SO I WOULD TELL HIM TO GIVE IT  A FEW DAYS. Additional Follow-up by: Peterson Ao,  June 24, 2010 12:10 PM  Notified patient of Mike Gip [facility] 's instructions to stop the Lotronex and not to take a laxative at this point. Patient stated understanding and will call if no results by Friday.

## 2010-06-30 NOTE — Miscellaneous (Signed)
Summary: Lotronex consent  Lotronex consent   Imported By: Lester [place] 06/25/2010 10:31:12  _____________________________________________________________________  External Attachment:    Type:   Image     Comment:   External Document

## 2010-07-11 ENCOUNTER — Ambulatory Visit: Payer: Self-pay | Admitting: Sports Medicine

## 2010-08-25 NOTE — Assessment & Plan Note (Signed)
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 Arvilla Market for 1 year history of crampy lower  abdominal pain and 3-4 loose, watery diarrheal bowel movements a day.   I saw [facility] many years ago because of refractory reflux, and he  underwent tests because of refractory reflux and underwent  fundoplication surgery by Dr. Kendrick Ranch.  He has not had followup  endoscopy since that time, but denies any reflux symptoms, dysphagia, or  hepatobiliary complaints.   The patient for the last year has had crampy lower abdominal pain with 3-  4 loose bowel movements a day, urgency, some symptoms like possible  steatorrhea, but no rectal bleeding or melena.  He denies any specific  food intolerances.  He is currently keeping a food diary.  He does  travel extensively, but not to [place] countries.  He had no sick  family members at home.  He has not been on multiple antibiotics.  He  has never had hepatitis, pancreatitis, or abdominal surgery except for  his fundoplication.   PAST MEDICAL HISTORY:  Remarkable for 10 years of adult onset diabetes,  which is managed by Dr. Arvilla Market.  He denies complications from his  diabetes.   MEDICATIONS:  1. Metformin 1000 mg in the morning and 500 mg at night.  2. Vytorin 10-40 mg a day.  3. Januvia 100 mg a day.  4. Glipizide 10 mg twice a day.  5. Lisinopril 5 mg a day.   ALLERGIES:  HE DENIES DRUG ALLERGIES.   FAMILY HISTORY:  Entirely noncontributory, except for family members  with diabetes.  There are no known colon polyps or colon carcinoma.   SOCIAL HISTORY:  He is married, lives with his wife.  He has a high  school education.  Works as a Water engineer.  He does not  smoke and uses ethanol socially, but denies problems with ethanol abuse  or dependency.   REVIEW OF SYSTEMS:  Entirely noncontributory without any cardiovascular,  pulmonary, genitourinary, neurologic, orthopedic, endocrine, or  dermatologic problems.   PHYSICAL EXAMINATION:  GENERAL:  He is a healthy-appearing white male in  no distress, appearing his stated age.  VITAL SIGNS:  He is 5 feet 10 and weighs 189 pounds.  Blood pressure is  106/68.  Pulse 72 and regular.  I could not appreciate stigmata of  chronic liver disease.  CHEST:  Clear.  He was in a regular rhythm without murmurs, rubs, or  gallops.  ABDOMEN:  Showed no organomegaly, masses, but there was tenderness to  deep palpation of the sigmoid colon.  Bowel sounds were normal.  RECTAL:  Inspection of the rectum was unremarkable as was rectal exam  with soft stool present that was guaiac negative.  EXTREMITIES:  Peripheral extremities were unremarkable.  MENTAL STATUS:  Normal.  ASSESSMENT:  1. One year of diarrhea with crampy lower abdominal pain and symptoms      which sound like possible inflammatory bowel disease.  2. Status post fundoplication surgery for refractory reflux.  3. History of Barrett's mucosa which needs endoscopic followup.  4. Adult onset diabetes without associated complications.   RECOMMENDATIONS:  1. Check stool culture and ova and parasites.  2. Check celiac panel, anemia panel, CBC, and sed rate.  3. Outpatient endoscopy and colonoscopy.  4. Try and start Lialda 2.4 g a day empirically.  5. Continue other multiple medications per Dr. Arvilla Market.     Vania Rea. Jarold Motto, MD, Caleen Essex, FAGA  Electronically Signed    DRP/MedQ  DD: 07/11/2007  DT: 07/11/2007  Job #: [id number]   cc:   Donia Guiles, M.D.

## 2010-08-25 NOTE — Cardiovascular Report (Signed)
NAME:  Alexander Chambers, Alexander Chambers NO.:  [id number]   MEDICAL RECORD NO.:  [id number]          PATIENT TYPE:  OIB   LOCATION:  2899                         FACILITY:  [facility]   PHYSICIAN:  Armanda Magic, M.D.     DATE OF BIRTH:  Sep 18, 1955   DATE OF PROCEDURE:  08/23/2006  DATE OF DISCHARGE:                            CARDIAC CATHETERIZATION   REFERRING PHYSICIAN:  Donia Guiles, M.D.   PROCEDURE:  1. Left heart catheterization.  2. Coronary angiography.  3. Left ventriculography.   OPERATOR:  Armanda Magic, M.D.   INDICATIONS:  Chest pain.   COMPLICATIONS:  None.   IV MEDICATIONS:  Versed 2 mg.   This a very pleasant 55 year old white male with a history of diabetes,  dyslipidemia, and hypertension who presented with episodes of chest pain  during strenuous activity. Stress test showed a small reversible defect  in the inferior base.  It was felt probably to represent gut and  diaphragmatic attenuation, but the patient continues to complain of  chest pain.  He now presents for cardiac catheterization.   The patient is brought to cardiac catheterization laboratory in a  fasting nonsedated state.  Informed consent was obtained.  The patient  was connected to continuous heart rate and pulse oximetry monitoring and  intermittent blood pressure monitoring.  The right groin was prepped and  draped in sterile fashion; 1% Xylocaine was used for local anesthesia.  Using the modified Seldinger technique, a 6-French sheath was placed in  the right femoral artery.  Under fluoroscopic guidance, a 6-French JL-4  catheter was placed in the left coronary artery.  Multiple cine films  were taken in the 30-degree RAO, 40-degree LAO views.  This catheter was  exchanged out over a guidewire for a 6-French JR-4 catheter which was  placed near the coronary ostium of the right coronary artery but could  not engage the ostium.  The catheter was exchanged out over a guidewire  for a  6-French no-toe right coronary artery catheter which was  successfully engaged in the right coronary ostium.  Multiple cine films  taken at 30-degree RAO, 40-degree LAO views.  This catheter was then  exchanged out over a guidewire for a 6-French angled pigtail catheter  which was placed under fluoroscopic guidance into the left ventricular  cavity.  Left ventriculography was performed in the 30-degree RAO view  using a total of 30 mL of contrast at 15 mL per second.  The catheter  was then pulled back across the aortic valve with no significant  gradient noted.  At the end procedure, all catheters and sheaths were  removed.  Manual compression was performed until adequate hemostasis was  obtained.  The patient was transferred back to his room in stable  condition.   RESULTS:  1. Left main coronary artery is widely patent.  It bifurcates into      left anterior descending artery and left circumflex artery.  Left      anterior descending artery is widely patent throughout its course,      the apex giving rise to diagonal branches.  The first  diagonal      branch is widely patent and bifurcates into two daughter branches,      both of which are widely patent.  The second diagonal is widely      patent as well and bifurcates into two daughter branches, both of      which are widely patent.   The left circumflex is widely patent throughout its course and gives  rise to a first obtuse marginal branch which is widely patent.  It  traverses the rest of the way in the AV groove and then terminates in a  second obtuse marginal branch which is widely patent.   The right coronary artery is widely patent throughout its course and  bifurcates distally into a posterior descending artery and  posterolateral artery.   Aortic pressure 111/71 mmHg. Left ventricular pressure 111/4 mmHg. LV  function normal with EF 55%, LVEDP 8 mmHg.   ASSESSMENT:  1. Normal coronary arteries.  2. Noncardiac chest  pain.  3. Normal left ventricular function.  4. Tachy palpitations.   PLAN:  1. Discharge home after IV fluid and bedrest.  2. Follow up with PA in 2 weeks for groin check.  3. Follow up with primary MD for workup of atypical chest pain.  4. No metformin until Aug 26, 2006.  The patient has been informed of      this.      Armanda Magic, M.D.     TT/MEDQ  D:  08/23/2006  T:  08/23/2006  Job:  [id number]   cc:   Donia Guiles, M.D.

## 2010-09-03 ENCOUNTER — Encounter: Payer: Self-pay | Admitting: Gastroenterology

## 2011-02-01 ENCOUNTER — Ambulatory Visit
Admission: RE | Admit: 2011-02-01 | Discharge: 2011-02-01 | Disposition: A | Discharge status: Home / Self Care | Payer: Commercial Managed Care - PPO | Source: Ambulatory Visit | Attending: Family Medicine | Admitting: Family Medicine

## 2011-02-01 ENCOUNTER — Other Ambulatory Visit: Payer: Self-pay | Admitting: Family Medicine

## 2011-02-01 DIAGNOSIS — R52 Pain, unspecified: Secondary | ICD-10-CM

## 2011-02-09 ENCOUNTER — Other Ambulatory Visit: Payer: Self-pay | Admitting: Family Medicine

## 2011-02-09 DIAGNOSIS — M792 Neuralgia and neuritis, unspecified: Secondary | ICD-10-CM

## 2011-02-12 ENCOUNTER — Ambulatory Visit
Admission: RE | Admit: 2011-02-12 | Discharge: 2011-02-12 | Disposition: A | Discharge status: Home / Self Care | Payer: Commercial Managed Care - PPO | Source: Ambulatory Visit | Attending: Family Medicine | Admitting: Family Medicine

## 2011-02-12 DIAGNOSIS — M792 Neuralgia and neuritis, unspecified: Secondary | ICD-10-CM

## 2011-09-04 ENCOUNTER — Emergency Department: Payer: Self-pay | Admitting: *Deleted

## 2011-09-04 ENCOUNTER — Encounter: Payer: Self-pay | Admitting: Internal Medicine

## 2011-09-04 LAB — COMPREHENSIVE METABOLIC PANEL
Alkaline Phosphatase: 52 U/L (ref 50–136)
Anion Gap: 11 (ref 7–16)
Bilirubin,Total: 0.7 mg/dL (ref 0.2–1.0)
Calcium, Total: 7.8 mg/dL — ABNORMAL LOW (ref 8.5–10.1)
EGFR (African American): >60
EGFR (Non-African Amer.): >60
SGOT(AST): 27 U/L (ref 15–37)
Sodium: 140 mmol/L (ref 136–145)

## 2011-09-04 LAB — URINALYSIS, COMPLETE
Bilirubin,UR: NEGATIVE
Blood: NEGATIVE
Glucose,UR: 150 mg/dL (ref 0–75)
Ketone: NEGATIVE
Nitrite: NEGATIVE
RBC,UR: 1 /HPF (ref 0–5)
Specific Gravity: 1.011 (ref 1.003–1.030)

## 2011-09-04 LAB — CBC
HCT: 44.9 % (ref 40.0–52.0)
HGB: 15 g/dL (ref 13.0–18.0)
Platelet: 193 10*3/uL (ref 150–440)
RBC: 5.12 10*6/uL (ref 4.40–5.90)
RDW: 14.1 % (ref 11.5–14.5)

## 2011-09-04 LAB — MAGNESIUM: Magnesium: 1.7 mg/dL — ABNORMAL LOW

## 2011-09-04 LAB — TROPONIN I: Troponin-I: <0.02 ng/mL

## 2011-09-14 ENCOUNTER — Encounter: Payer: Self-pay | Admitting: Internal Medicine

## 2011-09-14 ENCOUNTER — Encounter: Payer: Self-pay | Admitting: *Deleted

## 2011-09-14 ENCOUNTER — Ambulatory Visit (INDEPENDENT_AMBULATORY_CARE_PROVIDER_SITE_OTHER): Payer: Commercial Managed Care - PPO | Admitting: Internal Medicine

## 2011-09-14 VITALS — BP 134/89 | HR 88 | Ht 70.0 in | Wt 178.0 lb

## 2011-09-14 DIAGNOSIS — I498 Other specified cardiac arrhythmias: Secondary | ICD-10-CM

## 2011-09-14 DIAGNOSIS — I10 Essential (primary) hypertension: Secondary | ICD-10-CM

## 2011-09-14 DIAGNOSIS — I471 Supraventricular tachycardia, unspecified: Secondary | ICD-10-CM | POA: Insufficient documentation

## 2011-09-14 NOTE — Assessment & Plan Note (Signed)
His blood pressure is well controlled. He may be up to get off of his calcium channel blocker after ablation.

## 2011-09-14 NOTE — Progress Notes (Signed)
HPI Alexander Chambers is referred today by Dr. Smith for evaluation of SVT. The patient is a very pleasant middle-age man with a history of diabetes and hypertension. He has had palpitations and documented SVT for nearly 6 years. Initially the episodes were infrequent and self-limited. Over the last several months, they have increased in frequency and severity despite medical therapy with verapamil. The episodes start and stop suddenly. At times they can be terminated with vagal maneuvers but at other times adenosine has been required intravenously. He has documented SVT at rates of up to 200 beats per minute. Associated with SVT he experiences shortness of breath chest pressure and near syncope. He is concerned about driving. His never had frank syncope. No Known Allergies   Current Outpatient Prescriptions  Medication Sig Dispense Refill  . aspirin 81 MG tablet Take 81 mg by mouth daily.      . glipiZIDE (GLUCOTROL) 10 MG tablet Take 10 mg by mouth 2 (two) times daily before a meal.      . lisinopril (PRINIVIL,ZESTRIL) 5 MG tablet Take 5 mg by mouth daily.      . metFORMIN (GLUCOPHAGE) 500 MG tablet 2 AM AND 1 TAB PM      . Multiple Vitamin (MULTIVITAMIN) capsule Take 1 capsule by mouth daily.      . simvastatin (ZOCOR) 80 MG tablet Take 40 mg by mouth at bedtime.      . sitaGLIPtin (JANUVIA) 100 MG tablet Take 100 mg by mouth daily.      . verapamil (CALAN) 120 MG tablet Take 120 mg by mouth daily.         Past Medical History  Diagnosis Date  . DM   . GERD   . Barrett's esophagus   . Irritable bowel syndrome   . HYPERLIPIDEMIA   . HYPERTENSION   . GASTRITIS   . Diarrhea   . HIATAL HERNIA     ROS:   All systems reviewed and negative except as noted in the HPI.   Past Surgical History  Procedure Date  . Thoracic disc surgery   . Hernia repair      Family History  Problem Relation Age of Onset  . Diabetes Father   . Lung cancer Father   . Heart failure Mother      History    Social History  . Marital Status: Married    Spouse Name: N/A    Number of Children: N/A  . Years of Education: N/A   Occupational History  . Not on file.   Social History Main Topics  . Smoking status: Never Smoker   . Smokeless tobacco: Not on file  . Alcohol Use: Yes  . Drug Use: No  . Sexually Active: Not on file   Other Topics Concern  . Not on file   Social History Narrative  . No narrative on file     BP 134/89  Pulse 88  Ht 5' 10" (1.778 m)  Wt 178 lb (80.74 kg)  BMI 25.54 kg/m2  Physical Exam:  Well appearing middle-aged man, NAD HEENT: Unremarkable Neck:  No JVD, no thyromegally Lungs:  Clear with no wheezes, rales, or rhonchi. HEART:  Regular rate rhythm, no murmurs, no rubs, no clicks Abd:  soft, positive bowel sounds, no organomegally, no rebound, no guarding Ext:  2 plus pulses, no edema, no cyanosis, no clubbing Skin:  No rashes no nodules Neuro:  CN II through XII intact, motor grossly intact  EKG Normal sinus rhythm with   normal axis and intervals. No ventricular preexcitation is present.  Assess/Plan:   

## 2011-09-14 NOTE — Patient Instructions (Signed)

## 2011-09-14 NOTE — Assessment & Plan Note (Signed)
I discussed the treatment options with the patient. The risks, goals, benefits, and expectations of catheter ablation been discussed in detail. He would like to proceed. This will be scheduled early as possible pending at time.

## 2011-09-24 ENCOUNTER — Encounter: Payer: Self-pay | Admitting: Internal Medicine

## 2011-09-28 ENCOUNTER — Encounter (HOSPITAL_COMMUNITY): Payer: Self-pay

## 2011-09-30 ENCOUNTER — Other Ambulatory Visit: Payer: Self-pay | Admitting: *Deleted

## 2011-10-06 ENCOUNTER — Other Ambulatory Visit (INDEPENDENT_AMBULATORY_CARE_PROVIDER_SITE_OTHER): Payer: Commercial Managed Care - PPO

## 2011-10-06 DIAGNOSIS — I498 Other specified cardiac arrhythmias: Secondary | ICD-10-CM

## 2011-10-06 DIAGNOSIS — I471 Supraventricular tachycardia: Secondary | ICD-10-CM

## 2011-10-06 LAB — BASIC METABOLIC PANEL
CO2: 25 mEq/L (ref 19–32)
Chloride: 105 mEq/L (ref 96–112)
Glucose, Bld: 149 mg/dL — ABNORMAL HIGH (ref 70–99)
Potassium: 4.2 mEq/L (ref 3.5–5.1)
Sodium: 138 mEq/L (ref 135–145)

## 2011-10-06 LAB — CBC WITH DIFFERENTIAL/PLATELET
Basophils Absolute: 0 10*3/uL (ref 0.0–0.1)
Eosinophils Relative: 2 % (ref 0.0–5.0)
HCT: 44.2 % (ref 39.0–52.0)
Lymphocytes Relative: 26.6 % (ref 12.0–46.0)
Monocytes Relative: 10 % (ref 3.0–12.0)
Neutrophils Relative %: 61.1 % (ref 43.0–77.0)
Platelets: 203 10*3/uL (ref 150.0–400.0)
RDW: 14.7 % — ABNORMAL HIGH (ref 11.5–14.6)
WBC: 6.9 10*3/uL (ref 4.5–10.5)

## 2011-10-13 ENCOUNTER — Encounter (HOSPITAL_COMMUNITY)
Admission: RE | Disposition: A | Discharge status: Home / Self Care | Payer: Self-pay | Source: Ambulatory Visit | Attending: Internal Medicine

## 2011-10-13 ENCOUNTER — Ambulatory Visit (HOSPITAL_COMMUNITY)
Admission: RE | Admit: 2011-10-13 | Discharge: 2011-10-13 | Disposition: A | Discharge status: Home / Self Care | Payer: Commercial Managed Care - PPO | Source: Ambulatory Visit | Attending: Internal Medicine | Admitting: Internal Medicine

## 2011-10-13 ENCOUNTER — Encounter (HOSPITAL_COMMUNITY): Payer: Self-pay | Admitting: General Practice

## 2011-10-13 DIAGNOSIS — K219 Gastro-esophageal reflux disease without esophagitis: Secondary | ICD-10-CM | POA: Insufficient documentation

## 2011-10-13 DIAGNOSIS — E119 Type 2 diabetes mellitus without complications: Secondary | ICD-10-CM | POA: Insufficient documentation

## 2011-10-13 DIAGNOSIS — E785 Hyperlipidemia, unspecified: Secondary | ICD-10-CM | POA: Insufficient documentation

## 2011-10-13 DIAGNOSIS — I498 Other specified cardiac arrhythmias: Secondary | ICD-10-CM | POA: Insufficient documentation

## 2011-10-13 DIAGNOSIS — Z23 Encounter for immunization: Secondary | ICD-10-CM | POA: Insufficient documentation

## 2011-10-13 DIAGNOSIS — I10 Essential (primary) hypertension: Secondary | ICD-10-CM | POA: Insufficient documentation

## 2011-10-13 DIAGNOSIS — I471 Supraventricular tachycardia: Secondary | ICD-10-CM

## 2011-10-13 DIAGNOSIS — K449 Diaphragmatic hernia without obstruction or gangrene: Secondary | ICD-10-CM | POA: Insufficient documentation

## 2011-10-13 HISTORY — DX: Unspecified osteoarthritis, unspecified site: M19.90

## 2011-10-13 HISTORY — DX: Cardiac arrhythmia, unspecified: I49.9

## 2011-10-13 HISTORY — PX: SUPRAVENTRICULAR TACHYCARDIA ABLATION: SHX5492

## 2011-10-13 LAB — GLUCOSE, CAPILLARY: Glucose-Capillary: 196 mg/dL — ABNORMAL HIGH (ref 70–99)

## 2011-10-13 LAB — PROTIME-INR: INR: 0.99 (ref 0.00–1.49)

## 2011-10-13 SURGERY — SUPRAVENTRICULAR TACHYCARDIA ABLATION
Anesthesia: LOCAL

## 2011-10-13 MED ORDER — ASPIRIN EC 81 MG PO TBEC: 81.0000 mg | DELAYED_RELEASE_TABLET | Freq: Every day | ORAL | Status: DC

## 2011-10-13 MED ORDER — MIDAZOLAM HCL 5 MG/5ML IJ SOLN
INTRAMUSCULAR | Status: AC
  Filled 2011-10-13: qty 5

## 2011-10-13 MED ORDER — ATORVASTATIN CALCIUM 40 MG PO TABS
40.0000 mg | ORAL_TABLET | Freq: Every day | ORAL | Status: DC
  Filled 2011-10-13: qty 1

## 2011-10-13 MED ORDER — LINAGLIPTIN 5 MG PO TABS: 5.0000 mg | ORAL_TABLET | Freq: Every day | ORAL | Status: DC

## 2011-10-13 MED ORDER — METFORMIN HCL 500 MG PO TABS: 500.0000 mg | ORAL_TABLET | Freq: Two times a day (BID) | ORAL | Status: DC

## 2011-10-13 MED ORDER — SODIUM CHLORIDE 0.9 % IJ SOLN: 3.0000 mL | INTRAMUSCULAR | Status: DC | PRN

## 2011-10-13 MED ORDER — HYDROXYUREA 500 MG PO CAPS
ORAL_CAPSULE | ORAL | Status: AC
  Filled 2011-10-13: qty 1

## 2011-10-13 MED ORDER — METFORMIN HCL 500 MG PO TABS
1000.0000 mg | ORAL_TABLET | Freq: Every day | ORAL | Status: DC
  Filled 2011-10-13: qty 2

## 2011-10-13 MED ORDER — ONDANSETRON HCL 4 MG/2ML IJ SOLN: 4.0000 mg | Freq: Four times a day (QID) | INTRAMUSCULAR | Status: DC | PRN

## 2011-10-13 MED ORDER — BUPIVACAINE HCL (PF) 0.25 % IJ SOLN
INTRAMUSCULAR | Status: AC
  Filled 2011-10-13: qty 30

## 2011-10-13 MED ORDER — FENTANYL CITRATE 0.05 MG/ML IJ SOLN
INTRAMUSCULAR | Status: AC
  Filled 2011-10-13: qty 2

## 2011-10-13 MED ORDER — SODIUM CHLORIDE 0.9 % IV SOLN: 250.0000 mL | INTRAVENOUS | Status: DC | PRN

## 2011-10-13 MED ORDER — PNEUMOCOCCAL VAC POLYVALENT 25 MCG/0.5ML IJ INJ
0.5000 mL | INJECTION | Freq: Once | INTRAMUSCULAR | Status: AC
  Administered 2011-10-13: 0.5 mL via INTRAMUSCULAR
  Filled 2011-10-13: qty 0.5

## 2011-10-13 MED ORDER — ACETAMINOPHEN 325 MG PO TABS: 650.0000 mg | ORAL_TABLET | ORAL | Status: DC | PRN

## 2011-10-13 MED ORDER — GLIPIZIDE 10 MG PO TABS
10.0000 mg | ORAL_TABLET | Freq: Two times a day (BID) | ORAL | Status: DC
  Filled 2011-10-13 (×2): qty 1

## 2011-10-13 MED ORDER — LORATADINE 10 MG PO TABS
10.0000 mg | ORAL_TABLET | Freq: Every day | ORAL | Status: DC
Start: 2011-10-13 — End: 2011-10-13

## 2011-10-13 MED ORDER — METFORMIN HCL 500 MG PO TABS
500.0000 mg | ORAL_TABLET | Freq: Every day | ORAL | Status: DC
  Filled 2011-10-13: qty 1

## 2011-10-13 MED ORDER — SODIUM CHLORIDE 0.9 % IJ SOLN
3.0000 mL | Freq: Two times a day (BID) | INTRAMUSCULAR | Status: DC
  Administered 2011-10-13: 3 mL via INTRAVENOUS

## 2011-10-13 MED ORDER — HYDROCODONE-ACETAMINOPHEN 5-325 MG PO TABS: 1.0000 | ORAL_TABLET | ORAL | Status: DC | PRN

## 2011-10-13 MED ORDER — LISINOPRIL 5 MG PO TABS: 5.0000 mg | ORAL_TABLET | Freq: Every day | ORAL | Status: DC

## 2011-10-13 NOTE — H&P (View-Only) (Signed)
HPI Alexander Chambers is referred today by Dr. Katrinka Blazing for evaluation of SVT. The patient is a very pleasant middle-age man with a history of diabetes and hypertension. He has had palpitations and documented SVT for nearly 6 years. Initially the episodes were infrequent and self-limited. Over the last several months, they have increased in frequency and severity despite medical therapy with verapamil. The episodes start and stop suddenly. At times they can be terminated with vagal maneuvers but at other times adenosine has been required intravenously. He has documented SVT at rates of up to 200 beats per minute. Associated with SVT he experiences shortness of breath chest pressure and near syncope. He is concerned about driving. His never had frank syncope. No Known Allergies   Current Outpatient Prescriptions  Medication Sig Dispense Refill  . aspirin 81 MG tablet Take 81 mg by mouth daily.      Marland Kitchen glipiZIDE (GLUCOTROL) 10 MG tablet Take 10 mg by mouth 2 (two) times daily before a meal.      . lisinopril (PRINIVIL,ZESTRIL) 5 MG tablet Take 5 mg by mouth daily.      . metFORMIN (GLUCOPHAGE) 500 MG tablet 2 AM AND 1 TAB PM      . Multiple Vitamin (MULTIVITAMIN) capsule Take 1 capsule by mouth daily.      . simvastatin (ZOCOR) 80 MG tablet Take 40 mg by mouth at bedtime.      . sitaGLIPtin (JANUVIA) 100 MG tablet Take 100 mg by mouth daily.      . verapamil (CALAN) 120 MG tablet Take 120 mg by mouth daily.         Past Medical History  Diagnosis Date  . DM   . GERD   . Barrett's esophagus   . Irritable bowel syndrome   . HYPERLIPIDEMIA   . HYPERTENSION   . GASTRITIS   . Diarrhea   . HIATAL HERNIA     ROS:   All systems reviewed and negative except as noted in the HPI.   Past Surgical History  Procedure Date  . Thoracic disc surgery   . Hernia repair      Family History  Problem Relation Age of Onset  . Diabetes Father   . Lung cancer Father   . Heart failure Mother      History    Social History  . Marital Status: Married    Spouse Name: N/A    Number of Children: N/A  . Years of Education: N/A   Occupational History  . Not on file.   Social History Main Topics  . Smoking status: Never Smoker   . Smokeless tobacco: Not on file  . Alcohol Use: Yes  . Drug Use: No  . Sexually Active: Not on file   Other Topics Concern  . Not on file   Social History Narrative  . No narrative on file     BP 134/89  Pulse 88  Ht 5\' 10"  (1.778 m)  Wt 178 lb (80.74 kg)  BMI 25.54 kg/m2  Physical Exam:  Well appearing middle-aged man, NAD HEENT: Unremarkable Neck:  No JVD, no thyromegally Lungs:  Clear with no wheezes, rales, or rhonchi. HEART:  Regular rate rhythm, no murmurs, no rubs, no clicks Abd:  soft, positive bowel sounds, no organomegally, no rebound, no guarding Ext:  2 plus pulses, no edema, no cyanosis, no clubbing Skin:  No rashes no nodules Neuro:  CN II through XII intact, motor grossly intact  EKG Normal sinus rhythm with  normal axis and intervals. No ventricular preexcitation is present.  Assess/Plan:

## 2011-10-13 NOTE — Interval H&P Note (Signed)
History and Physical Interval Note:  10/13/2011 7:35 AM  Alexander Chambers  has presented today for surgery, with the diagnosis of svt  The various methods of treatment have been discussed with the patient and family. After consideration of risks, benefits and other options for treatment, the patient has consented to  Procedure(s) (LRB): SUPRAVENTRICULAR TACHYCARDIA ABLATION (N/A) as a surgical intervention .  The patient's history has been reviewed, patient examined, no change in status, stable for surgery.  I have reviewed the patients' chart and labs.  Questions were answered to the patient's satisfaction.     Lewayne Bunting

## 2011-10-13 NOTE — Op Note (Signed)
NAMEMarland Kitchen  DIONISIOS, RICCI NO.:  [id number]  MEDICAL RECORD NO.:  [id number]  LOCATION:  [facility]                         FACILITY:  [facility]  PHYSICIAN:  Doylene Canning. 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  INTRODUCTION:  The patient is a very pleasant 56 year old man with a history of tachy-palpitations and documented SVT, it rates up to 200 beats per minute.  He has been unresponsive to medical therapy.  He is now referred for catheter ablation.  PROCEDURE:  After informed consent was obtained, the patient was taken to the diagnostic EP lab in a fasting state.  After usual preparation and draping, intravenous fentanyl and midazolam were given for sedation. A 6-French hexapolar catheter was inserted percutaneously into the right jugular vein and advanced into the coronary sinus.  A 6-French quadripolar catheter was inserted percutaneously in the right femoral vein and advanced to the right ventricle.  A 6-French quadripolar catheter was inserted percutaneously in the right femoral vein and advanced to the His-bundle region.  After measurement of basic intervals, rapid ventricular pacing was carried out from the right ventricle demonstrating VA Wenckebach at 400 msec.  During rapid ventricular pacing, the atrial activation was midline and decremental. Next, programed ventricular stimulation was carried out from the right ventricle at base drive cycle length of 161 msec.  The S1-S2 interval was stepwise decreased to 380 msec with retrograde AV node ERP was observed.  During programed ventricular stimulation, there were no inducible SVT.  The atrial activation appeared to be midline and decremental.  Next, programed atrial stimulation was carried out from the atrium at a  base drive cycle length of 096 msec.  The S1-S2 interval was stepwise decreased down to 290 msec, where the AV node ERP was observed.  During programed atrial stimulation, there were multiple AH jumps and echo beats, but no inducible SVT.  Programed atrial stimulation was also carried out at a base drive cycle length of 045 msec.  The S1-S2 interval was carried down to 220 msec, where the AV node ERP was observed.  During programed atrial stimulation at a base drive cycle length of 409 msec, there were echo beats and multiple AH jumps.  There were also double echoes.  Finally rapid atrial pacing was carried out from the atrium at a base drive cycle length of 811 msec and was stepwise decreased down to 40 msec, where AV Wenckebach was observed.  During rapid atrial pacing, the PR interval was greater than the RR interval, but there was no inducible SVT.  At this point, isoproterenol was infused at rates of between 1 and 4 mics per minute. Additional rapid atrial pacing resulted in the initiation of SVT.  This was at a narrow QRS tachycardia at a cycle length of 325 msec.  PVCs were replaced at the time of His-bundle refractoriness, which did not pre-excite the atrium.  Ventricular pacing during SVT demonstrated a V-A- V conduction sequence.  At this point, isoproterenol was discontinued  and a 7-French quadripolar ablation catheter was maneuvered into [employer].  Mapping of Alycia Rossetti triangle was carried out.  This demonstrated unusually small Koch triangle.  It was somewhat anteriorly displaced. Total of 4 RF energy applications were then delivered to sites 5-7 in [place] triangle resultant in accelerated junctional rhythm.  Following this, additional rapid atrial pacing and programed atrial stimulation was carried out, demonstrating no slow pathway conduction. Isoproterenol was re-initiated and again there was no slow pathway conduction and no inducible SVT.  The catheters were then  removed, hemostasis was assured, and the patient was returned to his room in satisfactory condition.  COMPLICATIONS:  There were no immediate procedure complications.  RESULTS:  A.  Baseline ECG.  Baseline ECG demonstrates sinus rhythm with normal axis and intervals. B.  Baseline intervals.  Sinus node cycle length was 839 msec.  The QRS duration was 100 msec.  The HV interval was 48 msec.  Following ablation, the HV interval was 47 msec. C.  Rapid ventricular pacing.  Rapid ventricular pacing was carried out at the right ventricle, demonstrating VA Wenckebach at 4 msec.  During rapid ventricular pacing, the atrial activation was midline and decremental. D. Programed ventricular stimulation.  Programed ventricular stimulation was carried out from the right ventricle at a base drive cycle length of 161 msec with the S1-S2 interval stepwise decreased down to 380 msec, where the retrograde AV node ERP was observed.  During programed ventricular stimulation, the atrial activation sequence was midline and decremental. E.  Programed atrial stimulation. Programed atrial stimulation was carried out from the atrium at base drive cycle lengths of 096, 500, and 400 msec.  The S1-S2 interval was stepwise decreased down to the AV node ERP.  During programed atrial stimulation, there were multiple AH jumps and echo beats, and inducible SVT  of isoproterenol prior to ablation. After ablation, there were no AH's jumps, no echo beats, and no inducible SVT. 1. Rapid atrial pacing.  Rapid atrial pacing was carried out from the     atrium at a base drive cycle length of 045 msec and was stepwise     decreased down to 40 msec where AV Wenckebach was observed.     Following catheter ablation and on isoproterenol, rapid atrial     pacing was carried out down to 270 msec where AV Wenckebach was     observed.  It should be noted that prior to ablation, the PR     interval was greater than the RR interval.   After ablation, the PR     interval was less than the RR interval.     a.     Arrhythmias observed:  AV node reentry tachycardia      initiation with rapid atrial pacing on isoproterenol.  Duration      was sustained.  Termination was with rapid atrial pacing.     b.     Mapping.  Mapping of Alycia Rossetti triangle demonstrated anteriorly      displaced but small Koch triangle.     c.     RF energy application.  A total of 4 RF energy applications      were delivered to sites 5 through 7 in [place] triangle resultant in      accelerated junctional rhythm and rendering the slow pathway      nonconducting.  CONCLUSION:  This study demonstrates successful electrophysiologic study and RF catheter ablation of AV node reentry tachycardia with a total of 4 RF energy  applications delivered to the slow pathway region resultant in accelerated junctional rhythm and rendering a slow pathway nonconducting.     Doylene Canning. Ladona Ridgel, MD     GWT/MEDQ  D:  10/13/2011  T:  10/13/2011  Job:  [id number]  cc:   Donia Guiles, M.D.

## 2011-10-13 NOTE — Op Note (Signed)
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 [id number].

## 2011-10-13 NOTE — Progress Notes (Signed)
Pt discharged to home per MD order. Pt received all discharge instructions and medication information including follow-up appointments and prescriptions. Pt alert and oriented at discharge with no complaints of pain.  Pt groin site level 0.  Pt received PNA vaccine prior to discharge.  Efraim Kaufmann

## 2011-10-18 ENCOUNTER — Telehealth: Payer: Self-pay | Admitting: Internal Medicine

## 2011-10-18 NOTE — Telephone Encounter (Signed)
PT CALLED WANTING TO KNOW WHEN HE COULD GET ON TREADMILL  ,YARD WORK, WEED EATING , NORMAL ACTIVITY.  DISCUSSED WITH DR [name] RECOMMENDS  NO DRIVING FOR 3 DAYS  AND NO STRENUOUS ACTIVITY FOR A WEEK PT AWARE OF ABOVE./CY

## 2011-10-18 NOTE — Telephone Encounter (Signed)
Please return call to patient at [phone] regarding post ablation care instructions.

## 2012-01-18 ENCOUNTER — Ambulatory Visit (INDEPENDENT_AMBULATORY_CARE_PROVIDER_SITE_OTHER): Payer: Commercial Managed Care - PPO | Admitting: Internal Medicine

## 2012-01-18 ENCOUNTER — Encounter: Payer: Self-pay | Admitting: Internal Medicine

## 2012-01-18 VITALS — BP 112/70 | HR 81 | Ht 69.0 in | Wt 177.0 lb

## 2012-01-18 DIAGNOSIS — I471 Supraventricular tachycardia: Secondary | ICD-10-CM

## 2012-01-18 DIAGNOSIS — I498 Other specified cardiac arrhythmias: Secondary | ICD-10-CM

## 2012-01-18 DIAGNOSIS — E119 Type 2 diabetes mellitus without complications: Secondary | ICD-10-CM

## 2012-01-18 NOTE — Assessment & Plan Note (Signed)
He is status post catheter ablation of AV node reentrant tachycardia. He is done well. He has had no recurrent symptomatic arrhythmias. He will undergo watchful waiting.

## 2012-01-18 NOTE — Assessment & Plan Note (Signed)
He notes that his blood sugars have been elevated somewhat. He is considering insulin. I mentioned to him the importance of exercise and a low carbohydrate diet.

## 2012-01-18 NOTE — Progress Notes (Signed)
HPI Alexander Chambers returns today for followup. He is a very pleasant 56 year old man with a history of SVT status post catheter ablation of AV node reentrant tachycardia. In the interim, he has done well. He denies chest pain, shortness of breath, or peripheral edema. No syncope. He is contemplating going on subcutaneous insulin as his hemoglobin A1c has been elevated. No Known Allergies   Current Outpatient Prescriptions  Medication Sig Dispense Refill  . aspirin EC 81 MG tablet Take 81 mg by mouth daily.      Marland Kitchen glipiZIDE (GLUCOTROL) 10 MG tablet Take 10 mg by mouth 2 (two) times daily before a meal.      . lisinopril (PRINIVIL,ZESTRIL) 5 MG tablet Take 5 mg by mouth daily.      Marland Kitchen loratadine (CLARITIN) 10 MG tablet Take 10 mg by mouth daily.      . metFORMIN (GLUCOPHAGE) 500 MG tablet Take 500-1,000 mg by mouth 2 (two) times daily. 2 AM AND 1 TAB PM      . Multiple Vitamin (MULTIVITAMIN) capsule Take 1 capsule by mouth daily.      . simvastatin (ZOCOR) 80 MG tablet Take 40 mg by mouth at bedtime.      . sitaGLIPtin (JANUVIA) 100 MG tablet Take 100 mg by mouth daily.         Past Medical History  Diagnosis Date  . DM   . GERD   . Barrett's esophagus   . Irritable bowel syndrome   . HYPERLIPIDEMIA   . HYPERTENSION   . GASTRITIS   . Diarrhea   . HIATAL HERNIA   . Dysrhythmia     SVT  . Sleep apnea   . Arthritis     ROS:   All systems reviewed and negative except as noted in the HPI.   Past Surgical History  Procedure Date  . Thoracic disc surgery   . Hernia repair   . Novasure ablation 10/13/2011     Family History  Problem Relation Age of Onset  . Diabetes Father   . Lung cancer Father   . Heart failure Mother      History   Social History  . Marital Status: Married    Spouse Name: N/A    Number of Children: N/A  . Years of Education: N/A   Occupational History  . Not on file.   Social History Main Topics  . Smoking status: Never Smoker   . Smokeless  tobacco: Never Used  . Alcohol Use: Yes     DAILY  . Drug Use: No  . Sexually Active: Yes   Other Topics Concern  . Not on file   Social History Narrative  . No narrative on file     BP 112/70  Pulse 81  Ht 5\' 9"  (1.753 m)  Wt 177 lb (80.287 kg)  BMI 26.14 kg/m2  Physical Exam:  Well appearing middle-aged man, NAD HEENT: Unremarkable Neck:  No JVD, no thyromegally Lungs:  Clear with no wheezes, rales, or rhonchi. HEART:  Regular rate rhythm, no murmurs, no rubs, no clicks Abd:  soft, positive bowel sounds, no organomegally, no rebound, no guarding Ext:  2 plus pulses, no edema, no cyanosis, no clubbing Skin:  No rashes no nodules Neuro:  CN II through XII intact, motor grossly intact  EKG Normal sinus rhythm with normal axis and intervals. Biventricular for LVH is present.  Assess/Plan:

## 2012-02-14 ENCOUNTER — Other Ambulatory Visit: Payer: Self-pay | Admitting: Family Medicine

## 2012-02-14 ENCOUNTER — Ambulatory Visit
Admission: RE | Admit: 2012-02-14 | Discharge: 2012-02-14 | Disposition: A | Discharge status: Home / Self Care | Payer: Commercial Managed Care - PPO | Source: Ambulatory Visit | Attending: Family Medicine | Admitting: Family Medicine

## 2012-02-14 DIAGNOSIS — R52 Pain, unspecified: Secondary | ICD-10-CM

## 2013-01-31 ENCOUNTER — Other Ambulatory Visit: Payer: Self-pay | Admitting: Neurosurgery

## 2013-01-31 DIAGNOSIS — M542 Cervicalgia: Secondary | ICD-10-CM

## 2013-02-07 ENCOUNTER — Ambulatory Visit
Admission: RE | Admit: 2013-02-07 | Discharge: 2013-02-07 | Disposition: A | Discharge status: Home / Self Care | Payer: Commercial Managed Care - PPO | Source: Ambulatory Visit | Attending: Neurosurgery | Admitting: Neurosurgery

## 2013-02-07 DIAGNOSIS — M542 Cervicalgia: Secondary | ICD-10-CM

## 2013-09-11 ENCOUNTER — Encounter: Payer: Self-pay | Admitting: Gastroenterology

## 2013-09-18 ENCOUNTER — Encounter: Payer: Self-pay | Admitting: Internal Medicine

## 2013-10-04 ENCOUNTER — Ambulatory Visit (AMBULATORY_SURGERY_CENTER): Payer: Self-pay | Admitting: *Deleted

## 2013-10-04 VITALS — Ht 68.5 in | Wt 172.2 lb

## 2013-10-04 DIAGNOSIS — K2270 Barrett's esophagus without dysplasia: Secondary | ICD-10-CM

## 2013-10-04 NOTE — Progress Notes (Signed)
No egg or soy allergy  Pt denies problems with anesthesia.  He has had thoracic disc surgery, but states he can easily move his neck without difficulty

## 2013-10-10 ENCOUNTER — Encounter: Payer: Self-pay | Admitting: Internal Medicine

## 2013-10-17 ENCOUNTER — Encounter: Payer: Self-pay | Admitting: Internal Medicine

## 2013-10-17 ENCOUNTER — Ambulatory Visit (AMBULATORY_SURGERY_CENTER): Payer: Commercial Managed Care - PPO | Admitting: Internal Medicine

## 2013-10-17 VITALS — BP 114/77 | HR 68 | Temp 96.4°F | Resp 21 | Ht 68.0 in | Wt 172.0 lb

## 2013-10-17 DIAGNOSIS — K2270 Barrett's esophagus without dysplasia: Secondary | ICD-10-CM

## 2013-10-17 LAB — GLUCOSE, CAPILLARY
GLUCOSE-CAPILLARY: 124 mg/dL — AB (ref 70–99)
Glucose-Capillary: 147 mg/dL — ABNORMAL HIGH (ref 70–99)

## 2013-10-17 MED ORDER — SODIUM CHLORIDE 0.9 % IV SOLN: 500.0000 mL | INTRAVENOUS | Status: DC

## 2013-10-17 NOTE — Progress Notes (Signed)
Called to room to assist during endoscopic procedure.  Patient ID and intended procedure confirmed with present staff. Received instructions for my participation in the procedure from the performing physician.  

## 2013-10-17 NOTE — Progress Notes (Signed)
A/ox3 pleased with MAC, report to Jane RN 

## 2013-10-17 NOTE — Op Note (Signed)
[place]  [address]. [place], [id number]   ENDOSCOPY PROCEDURE REPORT  PATIENT: Alexander Chambers, Alexander Chambers  MR#: [id number] BIRTHDATE: Jun 13, 1955 , 58  yrs. old GENDER: Male ENDOSCOPIST: Jerene Bears, MD PROCEDURE DATE:  10/17/2013 PROCEDURE:  EGD w/ biopsy ASA CLASS:     Class III INDICATIONS:  follow up of Barrett's esophagus. MEDICATIONS: MAC sedation, administered by CRNA and propofol (Diprivan) 200mg  IV TOPICAL ANESTHETIC: none  DESCRIPTION OF PROCEDURE: After the risks benefits and alternatives of the procedure were thoroughly explained, informed consent was obtained.  The LB [id number] endoscope was introduced through the mouth and advanced to the second portion of the duodenum. Without limitations.  The instrument was slowly withdrawn as the mucosa was fully examined.    ESOPHAGUS: There was evidence of Barrett's esophagus beginning 34 cm from the incisors and extending to the top of the gastric folds at 38 cm.  There was 3 cm of circumferential Barrett's change and 2-3 cm of Barrett's tongues with islands. [place] Criteria C3M5-6 Multiple biopsies were performed using cold forceps.  Sample sent for histology.  STOMACH: A Nissen fundoplication was found in the cardia.  The site was intact.   There was mild antral gastropathy noted previously biopsied.  DUODENUM: The duodenal mucosa showed no abnormalities in the bulb and second portion of the duodenum.  Retroflexed views revealed as previous described (previous Nissen).     The scope was then withdrawn from the patient and the procedure completed.  COMPLICATIONS: There were no complications. ENDOSCOPIC IMPRESSION: 1.   There was evidence of Barrett's esophagus; multiple biopsies 2.   Nissen fundoplication was found in the cardia 3.   There was mild antral gastropathy noted 4.   The duodenal mucosa showed no abnormalities in the bulb and second portion of the  duodenum  RECOMMENDATIONS: Await biopsy results  eSigned:  Jerene Bears, MD 10/17/2013 10:41 AM  CC:The Patient and Mayra Neer, MD

## 2013-10-17 NOTE — Patient Instructions (Signed)
YOU HAD AN ENDOSCOPIC PROCEDURE TODAY AT [facility]: Refer to the procedure report that was given to you for any specific questions about what was found during the examination.  If the procedure report does not answer your questions, please call your gastroenterologist to clarify.  If you requested that your care partner not be given the details of your procedure findings, then the procedure report has been included in a sealed envelope for you to review at your convenience later.  YOU SHOULD EXPECT: Some feelings of bloating in the abdomen. Passage of more gas than usual.  Walking can help get rid of the air that was put into your GI tract during the procedure and reduce the bloating. If you had a lower endoscopy (such as a colonoscopy or flexible sigmoidoscopy) you may notice spotting of blood in your stool or on the toilet paper. If you underwent a bowel prep for your procedure, then you may not have a normal bowel movement for a few days.  DIET: Your first meal following the procedure should be a light meal and then it is ok to progress to your normal diet.  A half-sandwich or bowl of soup is an example of a good first meal.  Heavy or fried foods are harder to digest and may make you feel nauseous or bloated.  Likewise meals heavy in dairy and vegetables can cause extra gas to form and this can also increase the bloating.  Drink plenty of fluids but you should avoid alcoholic beverages for 24 hours.  ACTIVITY: Your care partner should take you home directly after the procedure.  You should plan to take it easy, moving slowly for the rest of the day.  You can resume normal activity the day after the procedure however you should NOT DRIVE or use heavy machinery for 24 hours (because of the sedation medicines used during the test).    SYMPTOMS TO REPORT IMMEDIATELY: A gastroenterologist can be reached at any hour.  During normal business hours, 8:30 AM to 5:00 PM Monday through Friday,  call [phone].  After hours and on weekends, please call the GI answering service at [phone] who will take a message and have the physician on call contact you.     Following upper endoscopy (EGD)  Vomiting of blood or coffee ground material  New chest pain or pain under the shoulder blades  Painful or persistently difficult swallowing  New shortness of breath  Fever of 100F or higher  Black, tarry-looking stools  FOLLOW UP: If any biopsies were taken you will be contacted by phone or by letter within the next 1-3 weeks.  Call your gastroenterologist if you have not heard about the biopsies in 3 weeks.  Our staff will call the home number listed on your records the next business day following your procedure to check on you and address any questions or concerns that you may have at that time regarding the information given to you following your procedure. This is a courtesy call and so if there is no answer at the home number and we have not heard from you through the emergency physician on call, we will assume that you have returned to your regular daily activities without incident.   Barrett's esophagus information given.  Await biopsy results.  SIGNATURES/CONFIDENTIALITY: You and/or your care partner have signed paperwork which will be entered into your electronic medical record.  These signatures attest to the fact that that the information above on your  After Visit Summary has been reviewed and is understood.  Full responsibility of the confidentiality of this discharge information lies with you and/or your care-partner.

## 2013-10-18 ENCOUNTER — Telehealth: Payer: Self-pay | Admitting: *Deleted

## 2013-10-18 NOTE — Telephone Encounter (Signed)
  Follow up Call-  Call back number 10/17/2013  Post procedure Call Back phone  # [phone]  Permission to leave phone message Yes     Patient questions:  Do you have a fever, pain , or abdominal swelling? No. Pain Score  0 *  Have you tolerated food without any problems? Yes.    Have you been able to return to your normal activities? Yes.    Do you have any questions about your discharge instructions: Diet   No. Medications  No. Follow up visit  No.  Do you have questions or concerns about your Care? No.  Actions: * If pain score is 4 or above: No action needed, pain <4.

## 2013-10-23 ENCOUNTER — Encounter: Payer: Self-pay | Admitting: Internal Medicine

## 2013-10-24 ENCOUNTER — Ambulatory Visit
Admission: RE | Admit: 2013-10-24 | Discharge: 2013-10-24 | Disposition: A | Discharge status: Home / Self Care | Payer: Commercial Managed Care - PPO | Source: Ambulatory Visit | Attending: Family Medicine | Admitting: Family Medicine

## 2013-10-24 ENCOUNTER — Other Ambulatory Visit: Payer: Self-pay | Admitting: Family Medicine

## 2013-10-24 DIAGNOSIS — M545 Low back pain, unspecified: Secondary | ICD-10-CM

## 2014-03-21 ENCOUNTER — Encounter (HOSPITAL_COMMUNITY): Payer: Self-pay | Admitting: Internal Medicine

## 2015-05-27 ENCOUNTER — Encounter: Payer: Self-pay | Admitting: Internal Medicine

## 2015-08-04 ENCOUNTER — Emergency Department: Payer: Commercial Managed Care - PPO

## 2015-08-04 ENCOUNTER — Emergency Department
Admission: EM | Admit: 2015-08-04 | Discharge: 2015-08-04 | Disposition: A | Discharge status: Home / Self Care | Payer: Commercial Managed Care - PPO | Attending: Emergency Medicine | Admitting: Emergency Medicine

## 2015-08-04 ENCOUNTER — Encounter: Payer: Self-pay | Admitting: Emergency Medicine

## 2015-08-04 DIAGNOSIS — M62838 Other muscle spasm: Secondary | ICD-10-CM

## 2015-08-04 DIAGNOSIS — Z79899 Other long term (current) drug therapy: Secondary | ICD-10-CM | POA: Insufficient documentation

## 2015-08-04 DIAGNOSIS — M4312 Spondylolisthesis, cervical region: Secondary | ICD-10-CM | POA: Diagnosis not present

## 2015-08-04 DIAGNOSIS — Z7984 Long term (current) use of oral hypoglycemic drugs: Secondary | ICD-10-CM | POA: Insufficient documentation

## 2015-08-04 DIAGNOSIS — E119 Type 2 diabetes mellitus without complications: Secondary | ICD-10-CM | POA: Insufficient documentation

## 2015-08-04 DIAGNOSIS — E785 Hyperlipidemia, unspecified: Secondary | ICD-10-CM | POA: Insufficient documentation

## 2015-08-04 DIAGNOSIS — M542 Cervicalgia: Secondary | ICD-10-CM

## 2015-08-04 DIAGNOSIS — I10 Essential (primary) hypertension: Secondary | ICD-10-CM | POA: Diagnosis not present

## 2015-08-04 DIAGNOSIS — Z7982 Long term (current) use of aspirin: Secondary | ICD-10-CM | POA: Diagnosis not present

## 2015-08-04 DIAGNOSIS — M4310 Spondylolisthesis, site unspecified: Secondary | ICD-10-CM

## 2015-08-04 MED ORDER — DIAZEPAM 2 MG PO TABS: 5.0000 mg | ORAL_TABLET | Freq: Three times a day (TID) | ORAL | Status: AC | PRN

## 2015-08-04 MED ORDER — KETOROLAC TROMETHAMINE 30 MG/ML IJ SOLN
30.0000 mg | Freq: Once | INTRAMUSCULAR | Status: AC
  Administered 2015-08-04: 30 mg via INTRAMUSCULAR
  Filled 2015-08-04: qty 1

## 2015-08-04 MED ORDER — DIAZEPAM 5 MG PO TABS
5.0000 mg | ORAL_TABLET | Freq: Once | ORAL | Status: AC
  Administered 2015-08-04: 5 mg via ORAL
  Filled 2015-08-04: qty 1

## 2015-08-04 MED ORDER — TRAMADOL HCL 50 MG PO TABS: 50.0000 mg | ORAL_TABLET | Freq: Four times a day (QID) | ORAL | Status: AC | PRN

## 2015-08-04 NOTE — ED Notes (Signed)
Pt alert and oriented X4, active, cooperative, pt in NAD. RR even and unlabored, color WNL.  Pt informed to return if any life threatening symptoms occur.   

## 2015-08-04 NOTE — ED Notes (Signed)
Neck pain x 1 week, yesterday became severe. No fall or injury. Pain with movement. Cervical disc surgery approx 3 years ago. Recent never conduction study due to poor dexterity R hand. States this has been since elbow surgery same arm.

## 2015-08-04 NOTE — ED Notes (Signed)
Patient transported to X-ray 

## 2015-08-04 NOTE — ED Provider Notes (Signed)
[facility] Emergency Department Provider Note  ____________________________________________  Time seen: Approximately 7:16 AM  I have reviewed the triage vital signs and the nursing notes.   HISTORY  Chief Complaint Neck Pain    HPI Alexander Chambers is a 60 y.o. male , NAD, presents to the emergency department with 1 week history of neck pain. 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 States pain in his neck significantly increased over the last 24 hours and these had significant decrease in range of motion. Pain is diffuse. Did take a tramadol last night which helped some. Also notes he recently had a nerve conduction study as this had limited use of his right hand since an elbow surgery. Denies any injury, traumas, falls. Has been working in the yard more than he usually does due to the better weather. Does not note any specific inciting incident that caused his pain. Has not noticed increased numbness, weakness, tingling. No recent illness nor any onset of fever, chills, body aches. Has not noted any redness, warmth, swelling or any skin sores.   Past Medical History  Diagnosis Date  . DM   . GERD   . Barrett's esophagus   . Irritable bowel syndrome   . HYPERLIPIDEMIA   . HYPERTENSION   . GASTRITIS   . Diarrhea   . HIATAL HERNIA   . Dysrhythmia     SVT  . Arthritis   . Allergy   . Sleep apnea     no CPAP    Patient Active Problem List   Diagnosis Date Noted  . SVT (supraventricular tachycardia) ([place]) 09/14/2011  . HYPERLIPIDEMIA 03/24/2010  . HYPERTENSION 03/24/2010  . GASTRITIS 03/24/2010  . HIATAL HERNIA 03/24/2010  . DIARRHEA 03/24/2010  . DM 03/02/2010  . GERD 03/02/2010  . BARRETT'S ESOPHAGUS 08/17/2007  . DIVERTICULOSIS-COLON 08/17/2007  . IRRITABLE BOWEL SYNDROME 08/17/2007    Past Surgical History  Procedure Laterality Date  . Thoracic disc surgery    . Hernia  repair    . Novasure ablation  10/13/2011  . Upper gastrointestinal endoscopy    . Supraventricular tachycardia ablation N/A 10/13/2011    Procedure: SUPRAVENTRICULAR TACHYCARDIA ABLATION;  Surgeon: Evans Lance, MD;  Location[facility] CATH LAB;  Service: Cardiovascular;  Laterality: N/A;    Current Outpatient Rx  Name  Route  Sig  Dispense  Refill  . aspirin EC 81 MG tablet   Oral   Take 81 mg by mouth daily.         . diazepam (VALIUM) 2 MG tablet   Oral   Take 2.5 tablets (5 mg total) by mouth every 8 (eight) hours as needed for anxiety or muscle spasms.   12 tablet   0   . glipiZIDE (GLUCOTROL) 10 MG tablet   Oral   Take 10 mg by mouth 2 (two) times daily before a meal.         . lisinopril (PRINIVIL,ZESTRIL) 5 MG tablet   Oral   Take 5 mg by mouth daily.         Marland Kitchen loratadine (CLARITIN) 10 MG tablet   Oral   Take 10 mg by mouth daily.         . metFORMIN (GLUCOPHAGE) 500 MG tablet   Oral   Take 500-1,000 mg by mouth 2 (two) times daily. 2 AM AND 1 TAB PM         .  Multiple Vitamin (MULTIVITAMIN) capsule   Oral   Take 1 capsule by mouth daily.         . simvastatin (ZOCOR) 80 MG tablet   Oral   Take 40 mg by mouth at bedtime.         . sitaGLIPtin (JANUVIA) 100 MG tablet   Oral   Take 100 mg by mouth daily.         . traMADol (ULTRAM) 50 MG tablet   Oral   Take 1 tablet (50 mg total) by mouth every 6 (six) hours as needed.   20 tablet   0     Allergies Review of patient's allergies indicates no known allergies.  Family History  Problem Relation Age of Onset  . Diabetes Father   . Lung cancer Father   . Heart failure Mother   . Colon cancer Neg Hx   . Esophageal cancer Neg Hx   . Stomach cancer Neg Hx   . Rectal cancer Neg Hx     Social History Social History  Substance Use Topics  . Smoking status: Never Smoker   . Smokeless tobacco: Never Used  . Alcohol Use: Yes     Comment: DAILY- 2 glasses of wine     Review of Systems   Constitutional: No fever/chills, fatigue Eyes: No visual changes.  ENT: No sore throat, throat swelling. Cardiovascular: No chest pain. Respiratory: No shortness of breath.  Musculoskeletal:  Positive neck pain and stiffness. Negative for back pain.  Skin: Negative for rash, redness, swelling, warmth, skin sores. Neurological: Negative for headaches, focal weakness or numbness. 10-point ROS otherwise negative.  ____________________________________________   PHYSICAL EXAM:  VITAL SIGNS: ED Triage Vitals  Enc Vitals Group     BP 08/04/15 0711 147/101 mmHg     Pulse Rate 08/04/15 0711 85     Resp 08/04/15 0711 18     Temp 08/04/15 0711 97.6 F (36.4 C)     Temp Source 08/04/15 0711 Oral     SpO2 08/04/15 0711 96 %     Weight 08/04/15 0711 158 lb (71.668 kg)     Height 08/04/15 0711 5\' 5"  (1.651 m)     Head Cir --      Peak Flow --      Pain Score 08/04/15 0714 9     Pain Loc --      Pain Edu? --      Excl. in [place]? --      Constitutional: Alert and oriented. Well appearing and in no acute distress. Eyes: Conjunctivae are normal.  Head: Atraumatic. Neck: No cervical spine tenderness to palpation. Mild trapezial muscle spasm is noted bilaterally but no pain to palpation. Decreased range of motion diffusely of the cervical spine. Hematological/Lymphatic/Immunilogical: No cervical lymphadenopathy. Cardiovascular: Normal rate, regular rhythm. Normal S1 and S2.  Good peripheral circulation. Respiratory: Normal respiratory effort without tachypnea or retractions. Lungs CTAB with breath sounds noted in all lung fields. Musculoskeletal: Full range of motion of upper extremities. No joint effusions. Neurologic:  Normal speech and language. No gross focal neurologic deficits are appreciated.  Skin:  Skin is warm, dry and intact. No rash noted. Psychiatric: Mood and affect are normal. Speech and behavior are normal. Patient exhibits appropriate insight and  judgement.   ____________________________________________   LABS  None ____________________________________________  EKG  None ____________________________________________  RADIOLOGY I have personally viewed and evaluated these images (plain radiographs) as part of my medical decision making, as well as reviewing the written  report by the radiologist.  Dg Cervical Spine Complete  08/04/2015  CLINICAL DATA:  60 year old male with posterior neck pain radiating bilaterally EXAM: CERVICAL SPINE - COMPLETE 4+ VIEW COMPARISON:  Prior CT scan of the cervical spine 02/07/2013; prior cervical spine radiographs 01/09/2013 FINDINGS: Surgical changes of prior anterior cervical discectomy and fusion with anterior plate and vertebral body screw construct at C5-C7. 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 No evidence of hardware complication. Mild left foraminal stenosis at C3-C4. Interval development of minimal anterolisthesis of C3 on C4 measuring approximately 2 mm. Otherwise, no new degenerative changes. No prevertebral soft tissue swelling or evidence of fracture. Atherosclerotic calcifications overlying both carotid bifurcations. IMPRESSION: 1. Surgical changes of prior ACDF without evidence of complication. 2. Developing mild anterolisthesis of C3 on C4 measuring approximately 2 mm. 3. Mild left foraminal stenosis at C3-C4. 4. Atherosclerotic calcifications in the region of the bilateral carotid bifurcations. Electronically Signed   By: Jacqulynn Cadet M.D.   On: 08/04/2015 08:04    ____________________________________________    PROCEDURES  Procedure(s) performed: None    Medications  ketorolac (TORADOL) 30 MG/ML injection 30 mg (30 mg Intramuscular Given 08/04/15 0831)  diazepam (VALIUM) tablet 5 mg (5 mg Oral Given 08/04/15 0830)   ----------------------------------------- 9:19 AM on 08/04/2015 -----------------------------------------  Patient notes significant reduction in  pain and has significantly increased range of motion of the cervical spine since being given Toradol and diazepam. He does notice increased sedation but he is accompanied by his wife who will be transporting him home. With him a lower dose of diazepam discharged to get him to his neurosurgical appointment.  ____________________________________________   INITIAL IMPRESSION / [place] / ED COURSE  Pertinent imaging results that were available during my care of the patient were reviewed by me and considered in my medical decision making (see chart for details).  Patient's diagnosis is consistent with anterolisthesis causing cervicalgia with muscle spasms of the neck. Patient will be discharged home with prescriptions for Ultram and diazepam to take as directed. Patient is to follow up with [facility]. I did call this office and they will be calling the patient to set up an appointment later on today. if symptoms persist past this treatment course. Patient is given ED precautions to return to the ED for any worsening or new symptoms.      ____________________________________________  FINAL CLINICAL IMPRESSION(S) / ED DIAGNOSES  Final diagnoses:  Anterolisthesis  Cervicalgia  Muscle spasms of neck      NEW MEDICATIONS STARTED DURING THIS VISIT:  New Prescriptions   DIAZEPAM (VALIUM) 2 MG TABLET    Take 2.5 tablets (5 mg total) by mouth every 8 (eight) hours as needed for anxiety or muscle spasms.   TRAMADOL (ULTRAM) 50 MG TABLET    Take 1 tablet (50 mg total) by mouth every 6 (six) hours as needed.         Braxton Feathers, PA-C 08/04/15 [id number]  Orbie Pyo, MD 08/04/15 [phone]

## 2015-08-04 NOTE — Discharge Instructions (Signed)
Heat Therapy Heat therapy can help ease sore, stiff, injured, and tight muscles and joints. Heat relaxes your muscles, which may help ease your pain.  RISKS AND COMPLICATIONS If you have any of the following conditions, do not use heat therapy unless your health care provider has approved:  Poor circulation.  Healing wounds or scarred skin in the area being treated.  Diabetes, heart disease, or high blood pressure.  Not being able to feel (numbness) the area being treated.  Unusual swelling of the area being treated.  Active infections.  Blood clots.  Cancer.  Inability to communicate pain. This may include young children and people who have problems with their brain function (dementia).  Pregnancy. Heat therapy should only be used on old, pre-existing, or long-lasting (chronic) injuries. Do not use heat therapy on new injuries unless directed by your health care provider. HOW TO USE HEAT THERAPY There are several different kinds of heat therapy, including:  Moist heat pack.  Warm water bath.  Hot water bottle.  Electric heating pad.  Heated gel pack.  Heated wrap.  Electric heating pad. Use the heat therapy method suggested by your health care provider. Follow your health care provider's instructions on when and how to use heat therapy. GENERAL HEAT THERAPY RECOMMENDATIONS  Do not sleep while using heat therapy. Only use heat therapy while you are awake.  Your skin may turn pink while using heat therapy. Do not use heat therapy if your skin turns red.  Do not use heat therapy if you have new pain.  High heat or long exposure to heat can cause burns. Be careful when using heat therapy to avoid burning your skin.  Do not use heat therapy on areas of your skin that are already irritated, such as with a rash or sunburn. SEEK MEDICAL CARE IF:  You have blisters, redness, swelling, or numbness.  You have new pain.  Your pain is worse. MAKE SURE  YOU:  Understand these instructions.  Will watch your condition.  Will get help right away if you are not doing well or get worse.   This information is not intended to replace advice given to you by your health care provider. Make sure you discuss any questions you have with your health care provider.   Document Released: 06/21/2011 Document Revised: 04/19/2014 Document Reviewed: 05/22/2013 Elsevier Interactive Patient Education 2016 Elsevier Inc.  Musculoskeletal Pain Musculoskeletal pain is muscle and boney aches and pains. These pains can occur in any part of the body. Your caregiver may treat you without knowing the cause of the pain. They may treat you if blood or urine tests, X-rays, and other tests were normal.  CAUSES There is often not a definite cause or reason for these pains. These pains may be caused by a type of germ (virus). The discomfort may also come from overuse. Overuse includes working out too hard when your body is not fit. Boney aches also come from weather changes. Bone is sensitive to atmospheric pressure changes. HOME CARE INSTRUCTIONS   Ask when your test results will be ready. Make sure you get your test results.  Only take over-the-counter or prescription medicines for pain, discomfort, or fever as directed by your caregiver. If you were given medications for your condition, do not drive, operate machinery or power tools, or sign legal documents for 24 hours. Do not drink alcohol. Do not take sleeping pills or other medications that may interfere with treatment.  Continue all activities unless the activities cause  more pain. When the pain lessens, slowly resume normal activities. Gradually increase the intensity and duration of the activities or exercise.  During periods of severe pain, bed rest may be helpful. Lay or sit in any position that is comfortable.  Putting ice on the injured area.  Put ice in a bag.  Place a towel between your skin and the  bag.  Leave the ice on for 15 to 20 minutes, 3 to 4 times a day.  Follow up with your caregiver for continued problems and no reason can be found for the pain. If the pain becomes worse or does not go away, it may be necessary to repeat tests or do additional testing. Your caregiver may need to look further for a possible cause. SEEK IMMEDIATE MEDICAL CARE IF:  You have pain that is getting worse and is not relieved by medications.  You develop chest pain that is associated with shortness or breath, sweating, feeling sick to your stomach (nauseous), or throw up (vomit).  Your pain becomes localized to the abdomen.  You develop any new symptoms that seem different or that concern you. MAKE SURE YOU:   Understand these instructions.  Will watch your condition.  Will get help right away if you are not doing well or get worse.   This information is not intended to replace advice given to you by your health care provider. Make sure you discuss any questions you have with your health care provider.   Document Released: 03/29/2005 Document Revised: 06/21/2011 Document Reviewed: 12/01/2012 Elsevier Interactive Patient Education [employer].

## 2016-02-09 ENCOUNTER — Telehealth (INDEPENDENT_AMBULATORY_CARE_PROVIDER_SITE_OTHER): Payer: Self-pay | Admitting: *Deleted

## 2016-02-09 DIAGNOSIS — M79641 Pain in right hand: Secondary | ICD-10-CM

## 2016-02-09 NOTE — Telephone Encounter (Signed)
Please advise 

## 2016-02-09 NOTE — Telephone Encounter (Signed)
Pt called wanting to know if he needs to schedule a consult with [facility] or if just needs to make appt with getting a nerve conduction done, states his issue is getting worse. Please call back at  [phone]

## 2016-02-10 NOTE — Telephone Encounter (Signed)
Left message on machine letting him know order for [facility]/EMG has been made and they will call him with the date and time

## 2016-02-10 NOTE — Telephone Encounter (Signed)
Yes let's set him up with [facility] to look for CTS

## 2016-02-20 ENCOUNTER — Ambulatory Visit (INDEPENDENT_AMBULATORY_CARE_PROVIDER_SITE_OTHER): Payer: Commercial Managed Care - PPO | Admitting: Physical Medicine and Rehabilitation

## 2016-02-20 ENCOUNTER — Encounter (INDEPENDENT_AMBULATORY_CARE_PROVIDER_SITE_OTHER): Payer: Self-pay | Admitting: Physical Medicine and Rehabilitation

## 2016-02-20 DIAGNOSIS — R202 Paresthesia of skin: Secondary | ICD-10-CM | POA: Diagnosis not present

## 2016-02-20 NOTE — Procedures (Signed)
EMG & NCV Findings: Evaluation of the right median motor nerve showed reduced amplitude (3.0 mV).  The right median (across palm) sensory nerve showed prolonged distal peak latency (Wrist, 3.9 ms) and reduced amplitude (9.9 V).  All remaining nerves (as indicated in the following tables) were within normal limits.    All examined muscles (as indicated in the following table) showed no evidence of electrical instability.    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   There is no significant electrodiagnostic evidence of any other focal nerve entrapment, brachial plexopathy or cervical radiculopathy. As you know, this particular electrodiagnostic study cannot rule out chemical radiculitis or sensory only radiculopathy.  Recommendations: 1.  Follow-up with referring physician. 2.  Continue current management of symptoms. 3.  Suggest carpal tunnel injection.   Nerve Conduction Studies Anti Sensory Summary Table   Stim Site NR Peak (ms) Norm Peak (ms) P-T Amp (V) Norm P-T Amp Site1 Site2 Delta-P (ms) Dist (cm) Vel (m/s) Norm Vel (m/s)  Right Median Acr Palm Anti Sensory (2nd Digit)  33.5C  Wrist    *3.9 <3.6 *9.9 >10 Wrist Palm 2.0 0.0    Palm    1.9 <2.0 4.1         Right Radial Anti Sensory (Base 1st Digit)  33C  Wrist    2.2 <3.1 17.3  Wrist Base 1st Digit 2.2 0.0    Right Ulnar Anti Sensory (5th Digit)  33.4C  Wrist    3.0 <3.7 18.0 >15.0 Wrist 5th Digit 3.0 14.0 47 >38   Motor Summary Table   Stim Site NR Onset (ms) Norm Onset (ms) O-P Amp (mV) Norm O-P Amp Site1 Site2 Delta-0 (ms) Dist (cm) Vel (m/s) Norm Vel (m/s)  Right Median Motor (Abd Poll Brev)  33.1C  Wrist    3.9 <4.2 *3.0 >5 Elbow Wrist 4.2 22.0 52 >50  Elbow    8.1  4.9         Right Ulnar Motor (Abd Dig Min)  33.1C  Wrist    2.7 <4.2 8.6 >3 B Elbow Wrist 3.8 22.5 59 >53  B Elbow    6.5  7.5  A  Elbow B Elbow 1.2 10.0 83 >53  A Elbow    7.7  7.4          EMG   Side Muscle Nerve Root Ins Act Fibs Psw Amp Dur Poly Recrt Int Fraser Din Comment  Right Abd Poll Brev Median C8-T1 Nml Nml Nml Nml Nml 0 Nml Nml   Right 1stDorInt Ulnar C8-T1 Nml Nml Nml Nml Nml 0 Nml Nml   Right PronatorTeres Median C6-7 Nml Nml Nml Nml Nml 0 Nml Nml   Right Biceps Musculocut C5-6 Nml Nml Nml Nml Nml 0 Nml Nml   Right Deltoid Axillary C5-6 Nml Nml Nml Nml Nml 0 Nml Nml     Nerve Conduction Studies Anti Sensory Left/Right Comparison   Stim Site L Lat (ms) R Lat (ms) L-R Lat (ms) L Amp (V) R Amp (V) L-R Amp (%) Site1 Site2 L Vel (m/s) R Vel (m/s) L-R Vel (m/s)  Median Acr Palm Anti Sensory (2nd Digit)  33.5C  Wrist  *3.9   *9.9  Wrist Palm     Palm  1.9   4.1        Radial Anti Sensory (Base 1st Digit)  33C  Wrist  2.2   17.3  Wrist  Base 1st Digit     Ulnar Anti Sensory (5th Digit)  33.4C  Wrist  3.0   18.0  Wrist 5th Digit  47    Motor Left/Right Comparison   Stim Site L Lat (ms) R Lat (ms) L-R Lat (ms) L Amp (mV) R Amp (mV) L-R Amp (%) Site1 Site2 L Vel (m/s) R Vel (m/s) L-R Vel (m/s)  Median Motor (Abd Poll Brev)  33.1C  Wrist  3.9   *3.0  Elbow Wrist  52   Elbow  8.1   4.9        Ulnar Motor (Abd Dig Min)  33.1C  Wrist  2.7   8.6  B Elbow Wrist  59   B Elbow  6.5   7.5  A Elbow B Elbow  83   A Elbow  7.7   7.4

## 2016-02-20 NOTE — Progress Notes (Signed)
Alexander Chambers - 60 y.o. male MRN [id number]  Date of birth: December 21, 1955  Office Visit Note: Visit Date: 02/20/2016 PCP: Mayra Neer, MD Referred by: Mayra Neer, MD  Subjective: Chief Complaint  Patient presents with  . Right Hand - Pain, Numbness  . Right Elbow - Pain   HPI: Alexander Chambers is a 60 year old gentleman with right hand pain for over a year.  He has had multiple elbow surgeries by Dr. Caralyn Guile. He then had carpal tunnel release surgery in April 2017.  He says that in the morning he cannot make a fist.  It feels very stiff. He does not feel like he has dexterity.  He does feel like he had improvement from the carpal tunnel surgery.  He does have intermittent numbness and tingling in the second and third digit. His symptoms were more constant prior to surgery. He is also had prior ACDF from C5-C7. He did have prior electrodiagnostic studies before the carpal tunnel release but we do not have those for review.    Right hand dominant No lotions  ROS Otherwise per HPI.  Assessment & Plan: Visit Diagnoses:  1. Paresthesia of skin     Plan: Findings:  Right upper extremity electrodiagnostic study as done below. Patient had very mild findings of slowing of the sensory component of the median nerve at the wrist. This is likely residual and does not explain the totality of his symptoms. Dr. Wyline Copas may wish to perform a diagnostic carpal tunnel injection.  This test would not rule out cervical radiculitis. There was no radiculopathy noted. Also on the differential would be neurologic movement disorder.    Meds & Orders: No orders of the defined types were placed in this encounter.   Orders Placed This Encounter  Procedures  . NCV with EMG (electromyography)    Follow-up: Return for Please schedule follow-up with Dr. Erlinda Hong.   Procedures: No procedures performed  EMG & NCV Findings: Evaluation of the right median motor nerve showed reduced amplitude (3.0 mV).  The right  median (across palm) sensory nerve showed prolonged distal peak latency (Wrist, 3.9 ms) and reduced amplitude (9.9 V).  All remaining nerves (as indicated in the following tables) were within normal limits.    All examined muscles (as indicated in the following table) showed no evidence of electrical instability.    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   There is no significant electrodiagnostic evidence of any other focal nerve entrapment, brachial plexopathy or cervical radiculopathy. As you know, this particular electrodiagnostic study cannot rule out chemical radiculitis or sensory only radiculopathy.  Recommendations: 1.  Follow-up with referring physician. 2.  Continue current management of symptoms. 3.  Suggest carpal tunnel injection.   Nerve Conduction Studies Anti Sensory Summary Table   Stim Site NR Peak (ms) Norm Peak (ms) P-T Amp (V) Norm P-T Amp Site1 Site2 Delta-P (ms) Dist (cm) Vel (m/s) Norm Vel (m/s)  Right Median Acr Palm Anti Sensory (2nd Digit)  33.5C  Wrist    *3.9 <3.6 *9.9 >10 Wrist Palm 2.0 0.0    Palm    1.9 <2.0 4.1         Right Radial Anti Sensory (Base 1st Digit)  33C  Wrist    2.2 <3.1 17.3  Wrist Base 1st Digit 2.2 0.0    Right Ulnar Anti Sensory (5th Digit)  33.4C  Wrist    3.0 <  3.7 18.0 >15.0 Wrist 5th Digit 3.0 14.0 47 >38   Motor Summary Table   Stim Site NR Onset (ms) Norm Onset (ms) O-P Amp (mV) Norm O-P Amp Site1 Site2 Delta-0 (ms) Dist (cm) Vel (m/s) Norm Vel (m/s)  Right Median Motor (Abd Poll Brev)  33.1C  Wrist    3.9 <4.2 *3.0 >5 Elbow Wrist 4.2 22.0 52 >50  Elbow    8.1  4.9         Right Ulnar Motor (Abd Dig Min)  33.1C  Wrist    2.7 <4.2 8.6 >3 B Elbow Wrist 3.8 22.5 59 >53  B Elbow    6.5  7.5  A Elbow B Elbow 1.2 10.0 83 >53  A Elbow    7.7  7.4          EMG   Side Muscle Nerve Root Ins Act Fibs Psw  Amp Dur Poly Recrt Int Fraser Din Comment  Right Abd Poll Brev Median C8-T1 Nml Nml Nml Nml Nml 0 Nml Nml   Right 1stDorInt Ulnar C8-T1 Nml Nml Nml Nml Nml 0 Nml Nml   Right PronatorTeres Median C6-7 Nml Nml Nml Nml Nml 0 Nml Nml   Right Biceps Musculocut C5-6 Nml Nml Nml Nml Nml 0 Nml Nml   Right Deltoid Axillary C5-6 Nml Nml Nml Nml Nml 0 Nml Nml     Nerve Conduction Studies Anti Sensory Left/Right Comparison   Stim Site L Lat (ms) R Lat (ms) L-R Lat (ms) L Amp (V) R Amp (V) L-R Amp (%) Site1 Site2 L Vel (m/s) R Vel (m/s) L-R Vel (m/s)  Median Acr Palm Anti Sensory (2nd Digit)  33.5C  Wrist  *3.9   *9.9  Wrist Palm     Palm  1.9   4.1        Radial Anti Sensory (Base 1st Digit)  33C  Wrist  2.2   17.3  Wrist Base 1st Digit     Ulnar Anti Sensory (5th Digit)  33.4C  Wrist  3.0   18.0  Wrist 5th Digit  47    Motor Left/Right Comparison   Stim Site L Lat (ms) R Lat (ms) L-R Lat (ms) L Amp (mV) R Amp (mV) L-R Amp (%) Site1 Site2 L Vel (m/s) R Vel (m/s) L-R Vel (m/s)  Median Motor (Abd Poll Brev)  33.1C  Wrist  3.9   *3.0  Elbow Wrist  52   Elbow  8.1   4.9        Ulnar Motor (Abd Dig Min)  33.1C  Wrist  2.7   8.6  B Elbow Wrist  59   B Elbow  6.5   7.5  A Elbow B Elbow  83   A Elbow  7.7   7.4              Clinical History: SPINE - COMPLETE 4+ VIEW 08/04/2015  COMPARISON:  Prior CT scan of the cervical spine 02/07/2013; prior cervical spine radiographs 01/09/2013  FINDINGS: Surgical changes of prior anterior cervical discectomy and fusion with anterior plate and vertebral body screw construct at C5-C7. 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 No evidence of hardware complication. Mild left foraminal stenosis at C3-C4. Interval development of minimal anterolisthesis of C3 on C4 measuring approximately 2 mm. Otherwise, no new degenerative changes. No prevertebral soft tissue swelling or evidence of fracture. Atherosclerotic calcifications overlying both  carotid bifurcations.   He reports that he has never smoked. He has never used smokeless  tobacco. No results for input(s): HGBA1C, LABURIC in the last 8760 hours.  Objective:  VS:  HT:    WT:   BMI:     BP:   HR: bpm  TEMP: ( )  RESP:  Physical Exam  Constitutional: He appears well-developed and well-nourished. No distress.  Eyes: Conjunctivae are normal. Pupils are equal, round, and reactive to light.  Cardiovascular: Regular rhythm and intact distal pulses.   Pulmonary/Chest: Effort normal and breath sounds normal.  Musculoskeletal:  Patient doesn't have full supination of the right hand. He has no atrophy in the bilateral FDI or APB or hand intrinsics. He has good intact sensation in all dermatomal and nerve patterns. Rapid alternating movements is a little bit slower on the right than left but he does adequately perform the test.  Skin: Skin is warm.  Psychiatric: He has a normal mood and affect.    Ortho Exam Imaging: No results found.  Past Medical/Family/Surgical/Social History: Medications & Allergies reviewed per EMR Patient Active Problem List   Diagnosis Date Noted  . SVT (supraventricular tachycardia) ([place]) 09/14/2011  . HYPERLIPIDEMIA 03/24/2010  . HYPERTENSION 03/24/2010  . GASTRITIS 03/24/2010  . HIATAL HERNIA 03/24/2010  . DIARRHEA 03/24/2010  . DM 03/02/2010  . GERD 03/02/2010  . BARRETT'S ESOPHAGUS 08/17/2007  . DIVERTICULOSIS-COLON 08/17/2007  . IRRITABLE BOWEL SYNDROME 08/17/2007   Past Medical History:  Diagnosis Date  . Allergy   . Arthritis   . Barrett's esophagus   . Diarrhea   . DM   . Dysrhythmia    SVT  . GASTRITIS   . GERD   . HIATAL HERNIA   . HYPERLIPIDEMIA   . HYPERTENSION   . Irritable bowel syndrome   . Sleep apnea    no CPAP   Family History  Problem Relation Age of Onset  . Diabetes Father   . Lung cancer Father   . Heart failure Mother   . Colon cancer Neg Hx   . Esophageal cancer Neg Hx   . Stomach cancer Neg Hx    . Rectal cancer Neg Hx    Past Surgical History:  Procedure Laterality Date  . HERNIA REPAIR    . NOVASURE ABLATION  10/13/2011  . SUPRAVENTRICULAR TACHYCARDIA ABLATION N/A 10/13/2011   Procedure: SUPRAVENTRICULAR TACHYCARDIA ABLATION;  Surgeon: Evans Lance, MD;  Location: [facility] CATH LAB;  Service: Cardiovascular;  Laterality: N/A;  . THORACIC DISC SURGERY    . UPPER GASTROINTESTINAL ENDOSCOPY     Social History   Occupational History  . Not on file.   Social History Main Topics  . Smoking status: Never Smoker  . Smokeless tobacco: Never Used  . Alcohol use Yes     Comment: DAILY- 2 glasses of wine  . Drug use: No  . Sexual activity: Yes

## 2016-02-27 ENCOUNTER — Ambulatory Visit (INDEPENDENT_AMBULATORY_CARE_PROVIDER_SITE_OTHER): Payer: Commercial Managed Care - PPO | Admitting: Orthopaedic Surgery

## 2016-03-29 ENCOUNTER — Other Ambulatory Visit (INDEPENDENT_AMBULATORY_CARE_PROVIDER_SITE_OTHER): Payer: Self-pay | Admitting: Orthopaedic Surgery

## 2016-04-14 DIAGNOSIS — J329 Chronic sinusitis, unspecified: Secondary | ICD-10-CM | POA: Diagnosis not present

## 2016-04-14 DIAGNOSIS — L570 Actinic keratosis: Secondary | ICD-10-CM | POA: Diagnosis not present

## 2016-05-20 DIAGNOSIS — L812 Freckles: Secondary | ICD-10-CM | POA: Diagnosis not present

## 2016-05-20 DIAGNOSIS — D1800 Hemangioma unspecified site: Secondary | ICD-10-CM | POA: Diagnosis not present

## 2016-05-20 DIAGNOSIS — L821 Other seborrheic keratosis: Secondary | ICD-10-CM | POA: Diagnosis not present

## 2016-06-09 DIAGNOSIS — H9319 Tinnitus, unspecified ear: Secondary | ICD-10-CM | POA: Diagnosis not present

## 2016-06-09 DIAGNOSIS — K5792 Diverticulitis of intestine, part unspecified, without perforation or abscess without bleeding: Secondary | ICD-10-CM | POA: Diagnosis not present

## 2016-07-21 DIAGNOSIS — R590 Localized enlarged lymph nodes: Secondary | ICD-10-CM | POA: Diagnosis not present

## 2016-07-21 DIAGNOSIS — J019 Acute sinusitis, unspecified: Secondary | ICD-10-CM | POA: Diagnosis not present

## 2016-07-21 DIAGNOSIS — H6692 Otitis media, unspecified, left ear: Secondary | ICD-10-CM | POA: Diagnosis not present

## 2016-07-26 ENCOUNTER — Other Ambulatory Visit: Payer: Self-pay | Admitting: Family Medicine

## 2016-07-26 DIAGNOSIS — Z0000 Encounter for general adult medical examination without abnormal findings: Secondary | ICD-10-CM | POA: Diagnosis not present

## 2016-07-26 DIAGNOSIS — E782 Mixed hyperlipidemia: Secondary | ICD-10-CM | POA: Diagnosis not present

## 2016-07-26 DIAGNOSIS — Z1211 Encounter for screening for malignant neoplasm of colon: Secondary | ICD-10-CM | POA: Diagnosis not present

## 2016-07-26 DIAGNOSIS — Z125 Encounter for screening for malignant neoplasm of prostate: Secondary | ICD-10-CM | POA: Diagnosis not present

## 2016-07-26 DIAGNOSIS — R1032 Left lower quadrant pain: Secondary | ICD-10-CM

## 2016-07-26 DIAGNOSIS — E119 Type 2 diabetes mellitus without complications: Secondary | ICD-10-CM | POA: Diagnosis not present

## 2016-08-02 ENCOUNTER — Inpatient Hospital Stay
Admission: RE | Admit: 2016-08-02 | Discharge: 2016-08-02 | Disposition: A | Discharge status: Home / Self Care | Payer: Commercial Managed Care - PPO | Source: Ambulatory Visit | Attending: Family Medicine | Admitting: Family Medicine

## 2016-08-09 ENCOUNTER — Other Ambulatory Visit: Payer: Commercial Managed Care - PPO

## 2016-08-16 DIAGNOSIS — R945 Abnormal results of liver function studies: Secondary | ICD-10-CM | POA: Diagnosis not present

## 2016-08-16 DIAGNOSIS — R748 Abnormal levels of other serum enzymes: Secondary | ICD-10-CM | POA: Diagnosis not present

## 2016-08-23 ENCOUNTER — Ambulatory Visit
Admission: RE | Admit: 2016-08-23 | Discharge: 2016-08-23 | Disposition: A | Discharge status: Home / Self Care | Payer: Commercial Managed Care - PPO | Source: Ambulatory Visit | Attending: Family Medicine | Admitting: Family Medicine

## 2016-08-23 DIAGNOSIS — K5730 Diverticulosis of large intestine without perforation or abscess without bleeding: Secondary | ICD-10-CM | POA: Diagnosis not present

## 2016-08-23 DIAGNOSIS — R1032 Left lower quadrant pain: Secondary | ICD-10-CM

## 2016-08-23 MED ORDER — IOPAMIDOL (ISOVUE-300) INJECTION 61%
100.0000 mL | Freq: Once | INTRAVENOUS | Status: AC | PRN
  Administered 2016-08-23: 100 mL via INTRAVENOUS

## 2016-08-27 DIAGNOSIS — K5790 Diverticulosis of intestine, part unspecified, without perforation or abscess without bleeding: Secondary | ICD-10-CM | POA: Diagnosis not present

## 2016-08-27 DIAGNOSIS — K449 Diaphragmatic hernia without obstruction or gangrene: Secondary | ICD-10-CM | POA: Diagnosis not present

## 2016-09-03 ENCOUNTER — Encounter: Payer: Self-pay | Admitting: Internal Medicine

## 2016-09-14 ENCOUNTER — Encounter: Payer: Self-pay | Admitting: Internal Medicine

## 2016-09-21 ENCOUNTER — Ambulatory Visit: Payer: Commercial Managed Care - PPO | Admitting: *Deleted

## 2016-09-21 VITALS — Ht 69.0 in | Wt 164.0 lb

## 2016-09-21 DIAGNOSIS — K2270 Barrett's esophagus without dysplasia: Secondary | ICD-10-CM

## 2016-09-21 NOTE — Progress Notes (Signed)
Denies allergies to eggs or soy products. Denies complications with sedation or anesthesia. Denies O2 use. Denies use of diet or weight loss medications.  Emmi instructions given for endoscopy.  

## 2016-09-22 ENCOUNTER — Encounter: Payer: Self-pay | Admitting: Internal Medicine

## 2016-10-05 ENCOUNTER — Telehealth: Payer: Self-pay | Admitting: Internal Medicine

## 2016-10-05 ENCOUNTER — Ambulatory Visit (AMBULATORY_SURGERY_CENTER): Payer: Commercial Managed Care - PPO | Admitting: Internal Medicine

## 2016-10-05 ENCOUNTER — Encounter: Payer: Self-pay | Admitting: Internal Medicine

## 2016-10-05 ENCOUNTER — Telehealth: Payer: Self-pay

## 2016-10-05 VITALS — BP 121/79 | HR 66 | Temp 98.0°F | Resp 15 | Ht 69.0 in | Wt 164.0 lb

## 2016-10-05 DIAGNOSIS — K2270 Barrett's esophagus without dysplasia: Secondary | ICD-10-CM | POA: Diagnosis not present

## 2016-10-05 MED ORDER — PANTOPRAZOLE SODIUM 40 MG PO TBEC: 40.0000 mg | DELAYED_RELEASE_TABLET | Freq: Every day | ORAL | 5 refills | Status: DC

## 2016-10-05 MED ORDER — SODIUM CHLORIDE 0.9 % IV SOLN: 500.0000 mL | INTRAVENOUS | Status: DC

## 2016-10-05 NOTE — Op Note (Signed)
[facility] Patient Name: Alexander Chambers Procedure Date: 10/05/2016 7:49 AM MRN: [id number] Endoscopist: Jerene Bears , MD Age: 61 Referring MD:  Date of Birth: 01/19/1956 Gender: Male Account #: [id number] Procedure:                Upper GI endoscopy Indications:              Surveillance for malignancy due to personal history                            of Barrett's esophagus, last EGD 3 yrs ago Medicines:                Monitored Anesthesia Care Procedure:                Pre-Anesthesia Assessment:                           - Prior to the procedure, a History and Physical                            was performed, and patient medications and                            allergies were reviewed. The patient's tolerance of                            previous anesthesia was also reviewed. The risks                            and benefits of the procedure and the sedation                            options and risks were discussed with the patient.                            All questions were answered, and informed consent                            was obtained. Prior Anticoagulants: The patient has                            taken no previous anticoagulant or antiplatelet                            agents. ASA Grade Assessment: III - A patient with                            severe systemic disease. After reviewing the risks                            and benefits, the patient was deemed in                            satisfactory condition to undergo the procedure.  After obtaining informed consent, the endoscope was                            passed under direct vision. Throughout the                            procedure, the patient's blood pressure, pulse, and                            oxygen saturations were monitored continuously. The                            Model GIF-HQ190 [id number]) scope was introduced                            through  the mouth, and advanced to the second part                            of duodenum. The upper GI endoscopy was                            accomplished without difficulty. The patient                            tolerated the procedure well. Scope In: Scope Out: Findings:                 The esophagus and gastroesophageal junction were                            examined with white light and narrow band imaging                            (NBI) from a forward view and retroflexed position.                            There were esophageal mucosal changes secondary to                            established short-segment Barrett's disease. These                            changes involved the mucosa at the upper extent of                            the gastric folds (36-37 cm from the incisors)                            extending to the Z-line (34 cm from the incisors).                            Tongues of salmon-colored mucosa were present and  scattered islands of salmon-colored mucosa were                            present (over this segment). The maximum                            longitudinal extent of these esophageal mucosal                            changes was 3 cm in length. Mucosa was biopsied                            with a cold forceps for histology in a targeted                            manner at intervals of 1 cm in the lower third of                            the esophagus. A total of 3 specimen bottles were                            sent to pathology.                           A 3 cm hiatal hernia was present. This represents                            recurrent hiatal hernia and partially slipped                            fundoplication.                           Evidence of a Nissen fundoplication was found in                            the cardia. The wrap appeared loose.                           A medium amount of food (residue) was  found in the                            gastric body raising the question of gastroparesis.                            This interfered slightly with complete examination                            of gastric mucosa.                           The exam of the stomach was otherwise normal.  The examined duodenum was normal. Complications:            No immediate complications. Estimated Blood Loss:     Estimated blood loss was minimal. Impression:               - Esophageal mucosal changes secondary to                            established short-segment Barrett's disease.                            Biopsied.                           - 3 cm hiatal hernia due to slipped fundoplication.                           - A medium amount of food (residue) in the stomach.                           - Normal examined duodenum. Recommendation:           - Patient has a contact number available for                            emergencies. The signs and symptoms of potential                            delayed complications were discussed with the                            patient. Return to normal activities tomorrow.                            Written discharge instructions were provided to the                            patient.                           - Resume previous diet.                           - Continue present medications. Given slipped                            fundoplication, return of reflux symptoms and known                            Barrett's esophagus, I recommend to resume PPI                            versus surgical consultation for re-do                            fundoplication.                           -  Await pathology results.                           - Repeat upper endoscopy for surveillance based on                            pathology results. Jerene Bears, MD 10/05/2016 8:44:39 AM This report has been signed electronically.

## 2016-10-05 NOTE — Telephone Encounter (Signed)
Telephone   10/05/2016 [place]  Dustin Flock T, RN   Advice Only  Reason for call   Conversation: Advice Only  (Newest Message First)  Me      10/05/16 2:59 PM  Note    Called Patients mobile and left message to call us back. Tried Patients home number and talked to patient. He states that his uvula feels swollen and is making him gag. He is swallowing water fine and took tylenol without any problems. I consulted Dr.Pyrtle and he advised for the patient to try a benadryl if he wanted but to try breathing through his nose and eating some ice chips. He did not feel like it was an allergy and thought that the swelling was due to all the suctioning during the procedure. Advised if not feeling better to call the MD on call, per Dr. [name]. Dr. [name] also wants [place] to check on this matter tomorrow morning. Patient agreed and has no other concerns.          10/05/16 2:58 PM    You attempted to contact [facility] (Emergency Contact) (Left Message)  Additional Documentation   Encounter Info:   Billing Info,   History,   Allergies,   Detailed Report     Orders Placed    None  Medication Renewals and Changes     None    Medication List  Visit Diagnoses     None    Problem List

## 2016-10-05 NOTE — Progress Notes (Signed)
Called to room to assist during endoscopic procedure.  Patient ID and intended procedure confirmed with present staff. Received instructions for my participation in the procedure from the performing physician.  

## 2016-10-05 NOTE — Telephone Encounter (Signed)
Called Patients mobile and left message to call us back. Tried Patients home number and talked to patient. He states that his uvula feels swollen and is making him gag. He is swallowing water fine and took tylenol without any problems. I consulted Dr.Pyrtle and he advised for the patient to try a benadryl if he wanted but to try breathing through his nose and eating some ice chips. He did not feel like it was an allergy and thought that the swelling was due to all the suctioning during the procedure. Advised if not feeling better to call the MD on call, per Dr. [name]. Dr. [name] also wants [place] to check on this matter tomorrow morning. Patient agreed and has no other concerns.

## 2016-10-05 NOTE — Patient Instructions (Signed)
YOU HAD AN ENDOSCOPIC PROCEDURE TODAY AT [facility]:   Refer to the procedure report that was given to you for any specific questions about what was found during the examination.  If the procedure report does not answer your questions, please call your gastroenterologist to clarify.  If you requested that your care partner not be given the details of your procedure findings, then the procedure report has been included in a sealed envelope for you to review at your convenience later.  YOU SHOULD EXPECT: Some feelings of bloating in the abdomen. Passage of more gas than usual.  Walking can help get rid of the air that was put into your GI tract during the procedure and reduce the bloating. If you had a lower endoscopy (such as a colonoscopy or flexible sigmoidoscopy) you may notice spotting of blood in your stool or on the toilet paper. If you underwent a bowel prep for your procedure, you may not have a normal bowel movement for a few days.  Please Note:  You might notice some irritation and congestion in your nose or some drainage.  This is from the oxygen used during your procedure.  There is no need for concern and it should clear up in a day or so.  SYMPTOMS TO REPORT IMMEDIATELY:   Following upper endoscopy (EGD)  Vomiting of blood or coffee ground material  New chest pain or pain under the shoulder blades  Painful or persistently difficult swallowing  New shortness of breath  Fever of 100F or higher  Black, tarry-looking stools  For urgent or emergent issues, a gastroenterologist can be reached at any hour by calling [phone].   DIET:  We do recommend a small meal at first, but then you may proceed to your regular diet.  Drink plenty of fluids but you should avoid alcoholic beverages for 24 hours.  ACTIVITY:  You should plan to take it easy for the rest of today and you should NOT DRIVE or use heavy machinery until tomorrow (because of the sedation medicines used  during the test).    FOLLOW UP: Our staff will call the number listed on your records the next business day following your procedure to check on you and address any questions or concerns that you may have regarding the information given to you following your procedure. If we do not reach you, we will leave a message.  However, if you are feeling well and you are not experiencing any problems, there is no need to return our call.  We will assume that you have returned to your regular daily activities without incident.  If any biopsies were taken you will be contacted by phone or by letter within the next 1-3 weeks.  Please call us at [phone] if you have not heard about the biopsies in 3 weeks.   Await for biopsy results Office to call with follow up appointment with Dr. [name] Surgical referral office will call with appointment Start Protonix 1 tablet every day  SIGNATURES/CONFIDENTIALITY: You and/or your care partner have signed paperwork which will be entered into your electronic medical record.  These signatures attest to the fact that that the information above on your After Visit Summary has been reviewed and is understood.  Full responsibility of the confidentiality of this discharge information lies with you and/or your care-partner.

## 2016-10-05 NOTE — Progress Notes (Signed)
Dental advisory given to patientAlert and oriented x3, pleased with MAC, report to RN Sarah 

## 2016-10-06 ENCOUNTER — Telehealth: Payer: Self-pay | Admitting: *Deleted

## 2016-10-06 NOTE — Telephone Encounter (Signed)
  Follow up Call-  Call back number 10/05/2016  Post procedure Call Back phone  # [phone]  Permission to leave phone message Yes  Some recent data might be hidden     Patient questions:  Do you have a fever, pain , or abdominal swelling? Yes.   Pain Score  2 *  Have you tolerated food without any problems? Yes.    Have you been able to return to your normal activities? Yes.    Do you have any questions about your discharge instructions: Diet   No. Medications  No. Follow up visit  No.  Do you have questions or concerns about your Care? No.  Actions: * If pain score is 4 or above: No action needed, pain <4. Patient stating he still has a sore throat(1-2/10) and "something" is swollen at the back of his throat. This being unlike any other endoscopy he has had. Patient is returning to work today. Patient is able to eat and drink and denies any chest pain with radiation to the back. Patient denies fever. Patient able to sleep last evening as well as usual. Patient agreed to call with any increasing symptoms.

## 2016-10-08 ENCOUNTER — Telehealth: Payer: Self-pay

## 2016-10-08 NOTE — Telephone Encounter (Signed)
Pt scheduled to see Dr. Zella Richer with [facility] 11/03/16@10 :30am, pt to arrive there at 10am. [facility] spoke with pt regarding the appt.

## 2016-10-15 ENCOUNTER — Encounter: Payer: Self-pay | Admitting: Internal Medicine

## 2016-11-03 DIAGNOSIS — K219 Gastro-esophageal reflux disease without esophagitis: Secondary | ICD-10-CM | POA: Diagnosis not present

## 2016-11-03 DIAGNOSIS — K449 Diaphragmatic hernia without obstruction or gangrene: Secondary | ICD-10-CM | POA: Diagnosis not present

## 2016-11-04 ENCOUNTER — Other Ambulatory Visit (HOSPITAL_COMMUNITY): Payer: Self-pay | Admitting: General Surgery

## 2016-11-04 DIAGNOSIS — K449 Diaphragmatic hernia without obstruction or gangrene: Principal | ICD-10-CM

## 2016-11-04 DIAGNOSIS — K219 Gastro-esophageal reflux disease without esophagitis: Secondary | ICD-10-CM

## 2016-11-12 ENCOUNTER — Ambulatory Visit (HOSPITAL_COMMUNITY): Payer: Commercial Managed Care - PPO

## 2016-11-16 ENCOUNTER — Encounter (HOSPITAL_COMMUNITY)
Admission: RE | Admit: 2016-11-16 | Discharge: 2016-11-16 | Disposition: A | Discharge status: Home / Self Care | Payer: Commercial Managed Care - PPO | Source: Ambulatory Visit | Attending: General Surgery | Admitting: General Surgery

## 2016-11-16 DIAGNOSIS — R110 Nausea: Secondary | ICD-10-CM | POA: Diagnosis not present

## 2016-11-16 DIAGNOSIS — K449 Diaphragmatic hernia without obstruction or gangrene: Secondary | ICD-10-CM | POA: Diagnosis not present

## 2016-11-16 DIAGNOSIS — K219 Gastro-esophageal reflux disease without esophagitis: Secondary | ICD-10-CM | POA: Insufficient documentation

## 2016-11-16 MED ORDER — TECHNETIUM TC 99M SULFUR COLLOID
2.1000 | Freq: Once | INTRAVENOUS | Status: AC | PRN
  Administered 2016-11-16: 2.1 via INTRAVENOUS

## 2016-11-19 DIAGNOSIS — Z7984 Long term (current) use of oral hypoglycemic drugs: Secondary | ICD-10-CM | POA: Diagnosis not present

## 2016-11-19 DIAGNOSIS — E1165 Type 2 diabetes mellitus with hyperglycemia: Secondary | ICD-10-CM | POA: Diagnosis not present

## 2016-11-24 ENCOUNTER — Other Ambulatory Visit: Payer: Self-pay | Admitting: General Surgery

## 2016-11-24 DIAGNOSIS — K219 Gastro-esophageal reflux disease without esophagitis: Secondary | ICD-10-CM

## 2016-11-24 DIAGNOSIS — K449 Diaphragmatic hernia without obstruction or gangrene: Secondary | ICD-10-CM

## 2016-11-25 ENCOUNTER — Other Ambulatory Visit: Payer: Self-pay

## 2016-11-25 ENCOUNTER — Telehealth: Payer: Self-pay | Admitting: Gastroenterology

## 2016-11-25 DIAGNOSIS — R1319 Other dysphagia: Secondary | ICD-10-CM

## 2016-11-25 NOTE — Telephone Encounter (Signed)
Dr Pyrtle's patient that he had referred to Dr Zella Richer.  Please see the faxed office notes from Dr Zella Richer.  The patient is scheduled for an upper GI on 12/03/16 by Dr Zella Richer. Patient has had a normal GES.  Please review the records and advise on esophageal manometry scheduling.

## 2016-11-25 NOTE — Telephone Encounter (Signed)
Patient is in agreement with this plan. Please advise on the medication Protonix and how long you would want him off of it prior to testing.

## 2016-11-25 NOTE — Telephone Encounter (Signed)
Left a message for the patient to call back. Esophageal manometry is scheduled for 12/08/16 arrive at 8:00 am fasting. Information will be mailed to him also.

## 2016-11-25 NOTE — Telephone Encounter (Signed)
Please proceed with high resolution esophageal manometry

## 2016-11-26 NOTE — Telephone Encounter (Signed)
He should continue the pantoprazole uninterrupted, even for the manometry

## 2016-11-29 DIAGNOSIS — R5383 Other fatigue: Secondary | ICD-10-CM | POA: Diagnosis not present

## 2016-11-29 DIAGNOSIS — R6889 Other general symptoms and signs: Secondary | ICD-10-CM | POA: Diagnosis not present

## 2016-11-29 NOTE — Telephone Encounter (Signed)
Patient notified to continue his medications.

## 2016-12-03 ENCOUNTER — Encounter: Payer: Self-pay | Admitting: Internal Medicine

## 2016-12-03 ENCOUNTER — Ambulatory Visit
Admission: RE | Admit: 2016-12-03 | Discharge: 2016-12-03 | Disposition: A | Discharge status: Home / Self Care | Payer: Commercial Managed Care - PPO | Source: Ambulatory Visit | Attending: General Surgery | Admitting: General Surgery

## 2016-12-03 DIAGNOSIS — K219 Gastro-esophageal reflux disease without esophagitis: Secondary | ICD-10-CM

## 2016-12-03 DIAGNOSIS — K449 Diaphragmatic hernia without obstruction or gangrene: Secondary | ICD-10-CM

## 2016-12-08 ENCOUNTER — Ambulatory Visit (HOSPITAL_COMMUNITY)
Admission: RE | Admit: 2016-12-08 | Discharge: 2016-12-08 | Disposition: A | Discharge status: Home / Self Care | Payer: Commercial Managed Care - PPO | Source: Ambulatory Visit | Attending: Internal Medicine | Admitting: Internal Medicine

## 2016-12-08 ENCOUNTER — Encounter (HOSPITAL_COMMUNITY)
Admission: RE | Disposition: A | Discharge status: Home / Self Care | Payer: Self-pay | Source: Ambulatory Visit | Attending: Internal Medicine

## 2016-12-08 DIAGNOSIS — K449 Diaphragmatic hernia without obstruction or gangrene: Secondary | ICD-10-CM | POA: Diagnosis not present

## 2016-12-08 DIAGNOSIS — K219 Gastro-esophageal reflux disease without esophagitis: Secondary | ICD-10-CM

## 2016-12-08 HISTORY — PX: ESOPHAGEAL MANOMETRY: SHX5429

## 2016-12-08 SURGERY — MANOMETRY, ESOPHAGUS

## 2016-12-08 MED ORDER — LIDOCAINE VISCOUS 2 % MT SOLN
OROMUCOSAL | Status: AC
  Filled 2016-12-08: qty 15

## 2016-12-08 SURGICAL SUPPLY — 2 items
FACESHIELD LNG OPTICON STERILE (SAFETY) IMPLANT
GLOVE BIO SURGEON STRL SZ8 (GLOVE) ×6 IMPLANT

## 2016-12-10 ENCOUNTER — Encounter (HOSPITAL_COMMUNITY): Payer: Self-pay | Admitting: Internal Medicine

## 2016-12-20 ENCOUNTER — Telehealth: Payer: Self-pay

## 2016-12-20 NOTE — Telephone Encounter (Signed)
Here is a completed mano Please let patient know that esophageal motility appears normal. Some of his swallows take longer to clear his esophagus but the motility is intact This was done before surgery with [facility] Please fax copy of report to [employer] with pt and he is aware, report faxed to Dr. Ulyess Blossom.

## 2017-01-20 DIAGNOSIS — K219 Gastro-esophageal reflux disease without esophagitis: Secondary | ICD-10-CM | POA: Diagnosis not present

## 2017-01-20 DIAGNOSIS — K449 Diaphragmatic hernia without obstruction or gangrene: Secondary | ICD-10-CM | POA: Diagnosis not present

## 2017-01-31 DIAGNOSIS — Z23 Encounter for immunization: Secondary | ICD-10-CM | POA: Diagnosis not present

## 2017-01-31 DIAGNOSIS — E119 Type 2 diabetes mellitus without complications: Secondary | ICD-10-CM | POA: Diagnosis not present

## 2017-01-31 DIAGNOSIS — E782 Mixed hyperlipidemia: Secondary | ICD-10-CM | POA: Diagnosis not present

## 2017-01-31 DIAGNOSIS — Z01818 Encounter for other preprocedural examination: Secondary | ICD-10-CM | POA: Diagnosis not present

## 2017-02-07 ENCOUNTER — Encounter: Payer: Self-pay | Admitting: *Deleted

## 2017-02-21 NOTE — Progress Notes (Signed)
Please place orders in Epic as patient is being scheduled for a pre-op appointment! Thank you! 

## 2017-02-28 ENCOUNTER — Ambulatory Visit: Payer: Commercial Managed Care - PPO | Admitting: Internal Medicine

## 2017-02-28 ENCOUNTER — Encounter: Payer: Self-pay | Admitting: Internal Medicine

## 2017-02-28 VITALS — BP 110/80 | HR 84 | Ht 67.0 in | Wt 160.4 lb

## 2017-02-28 DIAGNOSIS — K219 Gastro-esophageal reflux disease without esophagitis: Secondary | ICD-10-CM

## 2017-02-28 DIAGNOSIS — K2270 Barrett's esophagus without dysplasia: Secondary | ICD-10-CM

## 2017-02-28 DIAGNOSIS — R6881 Early satiety: Secondary | ICD-10-CM | POA: Diagnosis not present

## 2017-02-28 DIAGNOSIS — R110 Nausea: Secondary | ICD-10-CM

## 2017-02-28 DIAGNOSIS — K8020 Calculus of gallbladder without cholecystitis without obstruction: Secondary | ICD-10-CM

## 2017-02-28 DIAGNOSIS — K449 Diaphragmatic hernia without obstruction or gangrene: Secondary | ICD-10-CM

## 2017-02-28 MED ORDER — PANTOPRAZOLE SODIUM 40 MG PO TBEC
40.0000 mg | DELAYED_RELEASE_TABLET | Freq: Two times a day (BID) | ORAL | 2 refills | Status: DC
Start: 2017-02-28 — End: 2017-05-20

## 2017-02-28 NOTE — Patient Instructions (Signed)
Please follow up with Dr [name] on 05/05/17 at 2:00 pm.  We have sent the following medications to your pharmacy for you to pick up at your convenience: Pantoprazole 40 mg twice daily before meals.  If you are age 61 or older, your body mass index should be between 23-30. Your Body mass index is 25.12 kg/m. If this is out of the aforementioned range listed, please consider follow up with your Primary Care Provider.  If you are age 13 or younger, your body mass index should be between 19-25. Your Body mass index is 25.12 kg/m. If this is out of the aformentioned range listed, please consider follow up with your Primary Care Provider.

## 2017-02-28 NOTE — Progress Notes (Signed)
Subjective:    Patient ID: Alexander Chambers, male    DOB: January 21, 1956, 61 y.o.   MRN: [id number]  HPI Alexander Chambers is a 61 year old male with a past medical history of GERD, Barrett's esophagus, hiatal hernia status post Nissen fundoplication now with recurrent hiatal hernia/slipped Nissen wrap, history of IBS with predominantly diarrhea, history of gallstones, history of diverticulosis, hypertension and hyperlipidemia who is seen in follow-up.  He is here alone today.  He was last seen at the time of his surveillance upper endoscopy which was performed on 10/05/2016.  EGD revealed short segment Barrett's with tongues of salmon colored mucosa over a 3 cm length.  Surveillance biopsies were performed which showed Barrett's esophagus without dysplasia.  There is a 3 cm recurrent hiatal hernia and partially slipped fundoplication.  There is a medium amount of food residue in the stomach raising the question of gastroparesis.  The stomach and examined duodenum were otherwise unremarkable.  He later had a high resolution esophageal manometry which showed normal relaxation of the EG junction, no significant peristaltic abnormality.  There was incomplete esophageal bolus clearance which was nonspecific and of unclear clinical significance.  He then had a gastric emptying study which was normal.  He has subsequently seen Dr. Zella Richer and Dr. [name] with [facility] and has plans for a redo Nissen fundoplication/hiatal hernia repair.  This is scheduled for early December 2018.  Symptomatically he is having some heartburn despite pantoprazole 40 mg once daily.  He is not having dysphagia.  He has had some early fullness and decrease in appetite overall.  Sometimes he can feel nausea just by looking at food but he has not vomiting.  He is lost about 10 pounds in the last year and about 30 pounds in the last 7 or 8 years.  Some of this weight loss has been unintentional.  He does not have abdominal  pain.  His bowel movements have been very between loose to at times slightly constipated.  Loose stools predominate on average of 3-5 stools per day.  This dates back many years and in fact he last had a colonoscopy for unexplained diarrhea and screening on 04/27/2010 by Dr. Verl Blalock.  This showed mild diverticulosis but no polyps.  Biopsies were performed which were negative for microscopic colitis.  He has not had any change in medication and is on multiple medications to control his diabetes.  His A1c has been near 6.0 and he reports his sugars very well controlled at length.  Lastly he had an upper GI series performed in late August 2018 which showed Nissen fundoplication herniated into the chest.  There is spontaneous reflux during the exam without stricture or esophagitis.  Incidentally gallstones were again seen he also had a CT scan of the abdomen and pelvis performed in May 2018 which showed mild left-sided diverticulosis without inflammation, or obstruction.  There was a moderately sized hiatal hernia.  Review of Systems As per HPI, otherwise negative  Current Medications, Allergies, Past Medical History, Past Surgical History, Family History and Social History were reviewed in [employer] record.      Objective:   Physical Exam BP 110/80 (BP Location: Left Arm, Patient Position: Sitting, Cuff Size: Normal)   Pulse 84   Ht 5\' 7"  (1.702 m) Comment: height measured without shoes  Wt 160 lb 6 oz (72.7 kg)   BMI 25.12 kg/m  Constitutional: Well-developed and well-nourished. No distress. HEENT: Normocephalic and atraumatic. Oropharynx is clear and  moist. Conjunctivae are normal.  No scleral icterus. Neck: Neck supple. Trachea midline. Cardiovascular: Normal rate, regular rhythm and intact distal pulses.  Pulmonary/chest: Effort normal and breath sounds normal. No wheezing, rales or rhonchi. Abdominal: Soft, nontender, nondistended. Bowel sounds active  throughout. There are no masses palpable.  Extremities: no clubbing, cyanosis, or edema Neurological: Alert and oriented to person place and time. Skin: Skin is warm and dry. Psychiatric: Normal mood and affect. Behavior is normal.      Assessment & Plan:  61 year old male with a past medical history of GERD, Barrett's esophagus, hiatal hernia status post Nissen fundoplication now with recurrent hiatal hernia/slipped Nissen wrap, history of IBS with predominantly diarrhea, history of gallstones, history of diverticulosis, hypertension and hyperlipidemia who is seen in follow-up.  1.  GERD with Barrett's esophagus/recurrent hiatal hernia --he does have Barrett's esophagus without dysplasia.  He is also had some breakthrough heartburn despite once daily PPI.  This is likely related to his widely patent GE junction in the setting of his slipped Nissen wrap and recurrent hiatal hernia.  He has plans for redo hiatal hernia repair in the next several weeks with Dr. [name] and Dr. Zella Richer.  In the interim I will increase pantoprazole to 40 mg twice daily before meals with hopes to decrease the dose after surgery.  However with Barrett's esophagus, I expect he may still need at least low-dose once daily PPI.  He will be due surveillance EGD in 3 years for Barrett's.  2.  Early satiety/nausea/retained food at EGD --gastric emptying study was normal on the day of the study in August 2018.  However, food residue was found in the stomach despite an overnight fast which is suggestive of at least intermittent gastroparesis.  He has had some issues with mild nausea and early fullness.  This may relate to his recurrent hiatal hernia and at present does not need additional medical therapy.  We can readdress this symptom after hiatal hernia repair.  3.  Gallstones --asymptomatic at present.  Seen incidentally by imaging  4.  Colon cancer screening --normal colonoscopy with excellent prep with Dr. Sharlett Iles as  described in the HPI.  There is no family history of colon cancer and therefore he would be due screening colonoscopy in January 2022.  I would like to see him back in about 3 months after which time he will of had surgery and we can assess any GI issues at that time. 25 minutes spent with the patient today. Greater than 50% was spent in counseling and coordination of care with the patient\

## 2017-03-07 NOTE — Patient Instructions (Signed)
PONCE SKILLMAN  03/07/2017   Your procedure is scheduled on: Friday, Nov. 30, 2018   Report to [facility] Main  Entrance   Take [place]  elevators to 3rd floor to  [place] at 5:30 AM.    Call this number if you have problems the morning of surgery [phone]    Remember: ONLY 1 PERSON MAY GO WITH YOU TO SHORT STAY TO GET  READY MORNING OF [place].   Do not eat food or drink liquids :After Midnight.    Take these medicines the morning of surgery with A SIP OF WATER: Bupropion, Pantoprazole   DO NOT TAKE ANY DIABETIC MEDICATIONS DAY OF YOUR SURGERY   Do NOT take evening dose of glucotrol and metformin the night before surgery                               You may not have any metal on your body including jewelry             Do not wear lotions, powders or perfumes, deodorant                          Men may shave face and neck.   Do not bring valuables to the hospital. [place].   Contacts, dentures or bridgework may not be worn into surgery.   Leave suitcase in the car. After surgery it may be brought to your room.              Please read over the following fact sheets you were given: _____________________________________________________________________             [facility] [place] - Preparing for Surgery Before surgery, you can play an important role.  Because skin is not sterile, your skin needs to be as free of germs as possible.  You can reduce the number of germs on your skin by washing with CHG (chlorahexidine gluconate) soap before surgery.  CHG is an antiseptic cleaner which kills germs and bonds with the skin to continue killing germs even after washing. Please DO NOT use if you have an allergy to CHG or antibacterial soaps.  If your skin becomes reddened/irritated stop using the CHG and inform your nurse when you arrive at Short Stay. Do not shave (including legs and underarms)  for at least 48 hours prior to the first CHG shower.  You may shave your face/neck.  Please follow these instructions carefully:  1.  Shower with CHG Soap the night before surgery and the  morning of surgery.  2.  If you choose to wash your hair, wash your hair first as usual with your normal  shampoo.  3.  After you shampoo, rinse your hair and body thoroughly to remove the shampoo.                             4.  Use CHG as you would any other liquid soap.  You can apply chg directly to the skin and wash.  Gently with a scrungie or clean washcloth.  5.  Apply the CHG Soap to your body ONLY FROM THE NECK DOWN.  Do   not use on face/ open                           Wound or open sores. Avoid contact with eyes, ears mouth and   genitals (private parts).                       Wash face,  Genitals (private parts) with your normal soap.             6.  Wash thoroughly, paying special attention to the area where your    surgery  will be performed.  7.  Thoroughly rinse your body with warm water from the neck down.  8.  DO NOT shower/wash with your normal soap after using and rinsing off the CHG Soap.                9.  Pat yourself dry with a clean towel.            10.  Wear clean pajamas.            11.  Place clean sheets on your bed the night of your first shower and do not  sleep with pets. Day of Surgery : Do not apply any lotions/deodorants the morning of surgery.  Please wear clean clothes to the hospital/surgery center.  FAILURE TO FOLLOW THESE INSTRUCTIONS MAY RESULT IN THE CANCELLATION OF YOUR SURGERY  PATIENT SIGNATURE_________________________________  NURSE SIGNATURE__________________________________  ________________________________________________________________________

## 2017-03-07 NOTE — Progress Notes (Signed)
Need orders in epic.  Preop on 03/09/17.

## 2017-03-07 NOTE — Pre-Procedure Instructions (Signed)
Last office note from Dr. [name] 02/28/17 in epic. EKG 01/31/17 in patient's chart

## 2017-03-09 ENCOUNTER — Other Ambulatory Visit: Payer: Self-pay

## 2017-03-09 ENCOUNTER — Encounter (HOSPITAL_COMMUNITY): Payer: Self-pay

## 2017-03-09 ENCOUNTER — Encounter (HOSPITAL_COMMUNITY)
Admission: RE | Admit: 2017-03-09 | Discharge: 2017-03-09 | Disposition: A | Discharge status: Home / Self Care | Payer: Commercial Managed Care - PPO | Source: Ambulatory Visit | Attending: Surgery | Admitting: Surgery

## 2017-03-09 ENCOUNTER — Ambulatory Visit: Payer: Self-pay | Admitting: Surgery

## 2017-03-09 DIAGNOSIS — I471 Supraventricular tachycardia: Secondary | ICD-10-CM | POA: Diagnosis not present

## 2017-03-09 DIAGNOSIS — Y738 Miscellaneous gastroenterology and urology devices associated with adverse incidents, not elsewhere classified: Secondary | ICD-10-CM | POA: Diagnosis not present

## 2017-03-09 DIAGNOSIS — K449 Diaphragmatic hernia without obstruction or gangrene: Secondary | ICD-10-CM | POA: Diagnosis not present

## 2017-03-09 DIAGNOSIS — Z7984 Long term (current) use of oral hypoglycemic drugs: Secondary | ICD-10-CM | POA: Diagnosis not present

## 2017-03-09 DIAGNOSIS — K580 Irritable bowel syndrome with diarrhea: Secondary | ICD-10-CM | POA: Diagnosis not present

## 2017-03-09 DIAGNOSIS — R1310 Dysphagia, unspecified: Secondary | ICD-10-CM | POA: Diagnosis not present

## 2017-03-09 DIAGNOSIS — E785 Hyperlipidemia, unspecified: Secondary | ICD-10-CM | POA: Diagnosis not present

## 2017-03-09 DIAGNOSIS — K8080 Other cholelithiasis without obstruction: Secondary | ICD-10-CM | POA: Diagnosis not present

## 2017-03-09 DIAGNOSIS — K2270 Barrett's esophagus without dysplasia: Secondary | ICD-10-CM | POA: Diagnosis not present

## 2017-03-09 DIAGNOSIS — K219 Gastro-esophageal reflux disease without esophagitis: Secondary | ICD-10-CM | POA: Diagnosis not present

## 2017-03-09 DIAGNOSIS — I10 Essential (primary) hypertension: Secondary | ICD-10-CM | POA: Diagnosis not present

## 2017-03-09 DIAGNOSIS — Z79899 Other long term (current) drug therapy: Secondary | ICD-10-CM | POA: Diagnosis not present

## 2017-03-09 DIAGNOSIS — T85898A Other specified complication of other internal prosthetic devices, implants and grafts, initial encounter: Secondary | ICD-10-CM | POA: Diagnosis present

## 2017-03-09 DIAGNOSIS — E119 Type 2 diabetes mellitus without complications: Secondary | ICD-10-CM | POA: Diagnosis not present

## 2017-03-09 DIAGNOSIS — M1990 Unspecified osteoarthritis, unspecified site: Secondary | ICD-10-CM | POA: Diagnosis not present

## 2017-03-09 HISTORY — DX: Personal history of other diseases of the digestive system: Z87.19

## 2017-03-09 LAB — BASIC METABOLIC PANEL
ANION GAP: 10 (ref 5–15)
BUN: 15 mg/dL (ref 6–20)
CO2: 25 mmol/L (ref 22–32)
Calcium: 8.9 mg/dL (ref 8.9–10.3)
Chloride: 100 mmol/L — ABNORMAL LOW (ref 101–111)
Creatinine, Ser: 1.26 mg/dL — ABNORMAL HIGH (ref 0.61–1.24)
GFR calc Af Amer: >60 mL/min (ref >60)
GFR calc non Af Amer: 60 mL/min — ABNORMAL LOW (ref >60)
GLUCOSE: 263 mg/dL — AB (ref 65–99)
POTASSIUM: 4.3 mmol/L (ref 3.5–5.1)
Sodium: 135 mmol/L (ref 135–145)

## 2017-03-09 LAB — CBC
HEMATOCRIT: 48.7 % (ref 39.0–52.0)
HEMOGLOBIN: 16.9 g/dL (ref 13.0–17.0)
MCH: 31.9 pg (ref 26.0–34.0)
MCHC: 34.7 g/dL (ref 30.0–36.0)
MCV: 91.9 fL (ref 78.0–100.0)
Platelets: 207 10*3/uL (ref 150–400)
RBC: 5.3 MIL/uL (ref 4.22–5.81)
RDW: 14.1 % (ref 11.5–15.5)
WBC: 7.7 10*3/uL (ref 4.0–10.5)

## 2017-03-09 LAB — HEMOGLOBIN A1C
Hgb A1c MFr Bld: 6.6 % — ABNORMAL HIGH (ref 4.8–5.6)
Mean Plasma Glucose: 142.72 mg/dL

## 2017-03-09 NOTE — H&P (View-Only) (Signed)
DETRICK DANI 01/20/2017 12:12 PM Location: [place] Office Patient #: [id number] DOB: 06/03/1955 Married / Language: [name] / Race: White Male   History of Present Illness [name] B. [name] MD; 01/20/2017 12:56 PM) Patient words: Alexander Chambers who is the husband of Eliav Mechling, patient was seen initially by Dr. Elinor Parkinson about redo Nissen fundoplication. He underwent a laparoscopic Nissen by Dr. Lennie Hummer in 1994 and since then has done well. About a few years ago he did have a normal varus but could not vomit but did have the dry heaves. In the last several months he's developed increasing problems with reflux at night. It was almost as if a CT scan done last summer for diverticulitis in which time I pointed out the recurrent hernia that made him more aware of symptoms he was having.  We discussed revisional surgery in the outcomes that can be expected or non-gently as good as those when this is a primary procedure. He likely wouldn't skin to schedule and around December.  The patient is a 61 year old male.   Allergies [name], CMA; 01/20/2017 12:13 PM) No Known Drug Allergies 11/03/2016  Medication History ([name], CMA; 01/20/2017 12:13 PM) MetFORMIN HCl (500MG  Tablet, Oral) Active. Lisinopril (5MG  Tablet, Oral) Active. Glyxambi (25-5MG  Tablet, Oral) Active. Simvastatin (80MG  Tablet, Oral) Active. Medications Reconciled  Vitals ([name] CMA; 01/20/2017 12:13 PM) 01/20/2017 12:12 PM Weight: 160.8 lb Height: 69in Body Surface Area: 1.88 m Body Mass Index: 23.75 kg/m  Pulse: 100 (Regular)  BP: 112/68 (Sitting, Left Arm, Standard)       Physical Exam (Aydyn Testerman B. [name] MD; 01/20/2017 12:57 PM) The physical exam findings are as follows: Note:thin white male who is a type II diabetic. Gross exam appears otherwise healthy.    Assessment & Plan [name] B. [name] MD; 01/20/2017 12:59 PM) HIATAL HERNIA WITH GERD  (K21.9) Impression: This is recurrent. It is symptomatic especially when he bends over. He has a lot of bloating. He has endoscopic signs of delayed gastric emptying which could be causing his symptoms to be much worse and not responding to medical treatment. Delayed gastric emptying could be related to the previous surgery or diabetic gastroparesis.  Plan: Start with nuclear medicine gastric emptying scan--WAS NORMAL. If that is normal then we'll proceed with upper GI-reviewed by me and manometry-normal per Dr. [name]  Schedule redo lap Nissen fundoplication.  [name], MD,FACS

## 2017-03-09 NOTE — Pre-Procedure Instructions (Signed)
Dr. Tobias Alexander made aware of glucose on CMP results, we will check CBG day of surgery, no further orders received at this time.

## 2017-03-09 NOTE — H&P (Signed)
Alexander Chambers 01/20/2017 12:12 PM Location: [place] Office Patient #: [id number] DOB: October 25, 1955 Married / Language: [name] / Race: White Male   History of Present Illness Alexander Key B. [name] MD; 01/20/2017 12:56 PM) Patient words: Alexander Chambers who is the husband of Alexander Chambers, patient was seen initially by Dr. Elinor Chambers about redo Nissen fundoplication. He underwent a laparoscopic Nissen by Dr. Lennie Chambers in 1994 and since then has Chambers well. About a few years ago he did have a normal varus but could not vomit but did have the dry heaves. In the last several months he's developed increasing problems with reflux at night. It was almost as if a CT scan Chambers last summer for diverticulitis in which time I pointed out the recurrent hernia that made him more aware of symptoms he was having.  We discussed revisional surgery in the outcomes that can be expected or non-gently as good as those when this is a primary procedure. He likely wouldn't skin to schedule and around December.  The patient is a 61 year old male.   Allergies Alexander Chambers, CMA; 01/20/2017 12:13 PM) No Known Drug Allergies 11/03/2016  Medication History (Alexander Chambers, CMA; 01/20/2017 12:13 PM) MetFORMIN HCl (500MG  Tablet, Oral) Active. Lisinopril (5MG  Tablet, Oral) Active. Glyxambi (25-5MG  Tablet, Oral) Active. Simvastatin (80MG  Tablet, Oral) Active. Medications Reconciled  Vitals (Alexander Chambers CMA; 01/20/2017 12:13 PM) 01/20/2017 12:12 PM Weight: 160.8 lb Height: 69in Body Surface Area: 1.88 m Body Mass Index: 23.75 kg/m  Pulse: 100 (Regular)  BP: 112/68 (Sitting, Left Arm, Standard)       Physical Exam (Alexander Vasconcelos B. [name] MD; 01/20/2017 12:57 PM) The physical exam findings are as follows: Note:thin white male who is a type II diabetic. Gross exam appears otherwise healthy.    Assessment & Plan Alexander Key B. [name] MD; 01/20/2017 12:59 PM) HIATAL HERNIA WITH GERD  (K21.9) Impression: This is recurrent. It is symptomatic especially when he bends over. He has a lot of bloating. He has endoscopic signs of delayed gastric emptying which could be causing his symptoms to be much worse and not responding to medical treatment. Delayed gastric emptying could be related to the previous surgery or diabetic gastroparesis.  Plan: Start with nuclear medicine gastric emptying scan--WAS NORMAL. If that is normal then we'll proceed with upper GI-reviewed by me and manometry-normal per Dr. Hilarie Chambers  Schedule redo lap Nissen fundoplication.  Alexander B. [name], MD,FACS

## 2017-03-09 NOTE — Pre-Procedure Instructions (Signed)
BMP faxed to Dr. [name] via epic.

## 2017-03-10 LAB — GLUCOSE, CAPILLARY: Glucose-Capillary: 274 mg/dL — ABNORMAL HIGH (ref 65–99)

## 2017-03-10 NOTE — Anesthesia Preprocedure Evaluation (Addendum)
Anesthesia Evaluation  Patient identified by MRN, date of birth, ID band Patient awake    Reviewed: Allergy & Precautions, NPO status , Patient's Chart, lab work & pertinent test results  Airway Mallampati: II  TM Distance: >3 FB Neck ROM: Full    Dental no notable dental hx.    Pulmonary neg pulmonary ROS,    Pulmonary exam normal breath sounds clear to auscultation       Cardiovascular hypertension, Pt. on medications negative cardio ROS Normal cardiovascular exam+ dysrhythmias  Rhythm:Regular Rate:Normal     Neuro/Psych negative neurological ROS  negative psych ROS   GI/Hepatic negative GI ROS, Neg liver ROS, hiatal hernia, GERD  Medicated,  Endo/Other  negative endocrine ROSdiabetes, Type 2  Renal/GU negative Renal ROS  negative genitourinary   Musculoskeletal negative musculoskeletal ROS (+) Arthritis , Osteoarthritis,    Abdominal   Peds negative pediatric ROS (+)  Hematology negative hematology ROS (+)   Anesthesia Other Findings   Reproductive/Obstetrics negative OB ROS                             Anesthesia Physical Anesthesia Plan  ASA: II  Anesthesia Plan: General   Post-op Pain Management:    Induction: Intravenous  PONV Risk Score and Plan: 2 and Ondansetron, Dexamethasone, Treatment may vary due to age or medical condition and Midazolam  Airway Management Planned: Oral ETT  Additional Equipment:   Intra-op Plan:   Post-operative Plan: Extubation in OR  Informed Consent:   Plan Discussed with:   Anesthesia Plan Comments: (  )        Anesthesia Quick Evaluation

## 2017-03-11 ENCOUNTER — Inpatient Hospital Stay (HOSPITAL_COMMUNITY): Payer: Commercial Managed Care - PPO | Admitting: Anesthesiology

## 2017-03-11 ENCOUNTER — Observation Stay (HOSPITAL_COMMUNITY)
Admission: RE | Admit: 2017-03-11 | Discharge: 2017-03-13 | Disposition: A | Discharge status: Home / Self Care | Payer: Commercial Managed Care - PPO | Source: Ambulatory Visit | Attending: Surgery | Admitting: Surgery

## 2017-03-11 ENCOUNTER — Other Ambulatory Visit: Payer: Self-pay

## 2017-03-11 ENCOUNTER — Encounter (HOSPITAL_COMMUNITY): Payer: Self-pay | Admitting: *Deleted

## 2017-03-11 ENCOUNTER — Encounter (HOSPITAL_COMMUNITY)
Admission: RE | Disposition: A | Discharge status: Home / Self Care | Payer: Self-pay | Source: Ambulatory Visit | Attending: Surgery

## 2017-03-11 DIAGNOSIS — K449 Diaphragmatic hernia without obstruction or gangrene: Secondary | ICD-10-CM | POA: Insufficient documentation

## 2017-03-11 DIAGNOSIS — E785 Hyperlipidemia, unspecified: Secondary | ICD-10-CM | POA: Insufficient documentation

## 2017-03-11 DIAGNOSIS — Z8719 Personal history of other diseases of the digestive system: Secondary | ICD-10-CM

## 2017-03-11 DIAGNOSIS — Z79899 Other long term (current) drug therapy: Secondary | ICD-10-CM | POA: Insufficient documentation

## 2017-03-11 DIAGNOSIS — K2270 Barrett's esophagus without dysplasia: Secondary | ICD-10-CM | POA: Insufficient documentation

## 2017-03-11 DIAGNOSIS — I10 Essential (primary) hypertension: Secondary | ICD-10-CM | POA: Insufficient documentation

## 2017-03-11 DIAGNOSIS — R1310 Dysphagia, unspecified: Secondary | ICD-10-CM | POA: Insufficient documentation

## 2017-03-11 DIAGNOSIS — M1990 Unspecified osteoarthritis, unspecified site: Secondary | ICD-10-CM | POA: Insufficient documentation

## 2017-03-11 DIAGNOSIS — E119 Type 2 diabetes mellitus without complications: Secondary | ICD-10-CM | POA: Diagnosis not present

## 2017-03-11 DIAGNOSIS — Y738 Miscellaneous gastroenterology and urology devices associated with adverse incidents, not elsewhere classified: Secondary | ICD-10-CM | POA: Insufficient documentation

## 2017-03-11 DIAGNOSIS — T85898A Other specified complication of other internal prosthetic devices, implants and grafts, initial encounter: Principal | ICD-10-CM | POA: Insufficient documentation

## 2017-03-11 DIAGNOSIS — K580 Irritable bowel syndrome with diarrhea: Secondary | ICD-10-CM | POA: Insufficient documentation

## 2017-03-11 DIAGNOSIS — K219 Gastro-esophageal reflux disease without esophagitis: Secondary | ICD-10-CM | POA: Diagnosis not present

## 2017-03-11 DIAGNOSIS — K8080 Other cholelithiasis without obstruction: Secondary | ICD-10-CM | POA: Insufficient documentation

## 2017-03-11 DIAGNOSIS — Z9889 Other specified postprocedural states: Secondary | ICD-10-CM

## 2017-03-11 DIAGNOSIS — Z7984 Long term (current) use of oral hypoglycemic drugs: Secondary | ICD-10-CM | POA: Insufficient documentation

## 2017-03-11 DIAGNOSIS — I471 Supraventricular tachycardia: Secondary | ICD-10-CM | POA: Insufficient documentation

## 2017-03-11 HISTORY — PX: LAPAROSCOPIC NISSEN FUNDOPLICATION: SHX1932

## 2017-03-11 LAB — GLUCOSE, CAPILLARY
GLUCOSE-CAPILLARY: 244 mg/dL — AB (ref 65–99)
GLUCOSE-CAPILLARY: 238 mg/dL — AB (ref 65–99)
GLUCOSE-CAPILLARY: 200 mg/dL — AB (ref 65–99)
Glucose-Capillary: 189 mg/dL — ABNORMAL HIGH (ref 65–99)
Glucose-Capillary: 159 mg/dL — ABNORMAL HIGH (ref 65–99)

## 2017-03-11 LAB — CBC
HCT: 44.7 % (ref 39.0–52.0)
HEMOGLOBIN: 14.7 g/dL (ref 13.0–17.0)
MCH: 30.2 pg (ref 26.0–34.0)
MCHC: 32.9 g/dL (ref 30.0–36.0)
MCV: 91.8 fL (ref 78.0–100.0)
PLATELETS: 199 10*3/uL (ref 150–400)
RBC: 4.87 MIL/uL (ref 4.22–5.81)
RDW: 14 % (ref 11.5–15.5)
WBC: 14.2 10*3/uL — ABNORMAL HIGH (ref 4.0–10.5)

## 2017-03-11 LAB — CREATININE, SERUM
CREATININE: 1.15 mg/dL (ref 0.61–1.24)
GFR calc Af Amer: >60 mL/min (ref >60)

## 2017-03-11 SURGERY — FUNDOPLICATION, NISSEN, LAPAROSCOPIC
Anesthesia: General | Site: Abdomen

## 2017-03-11 MED ORDER — HYDROCODONE-ACETAMINOPHEN 5-325 MG PO TABS
1.0000 | ORAL_TABLET | ORAL | Status: DC | PRN
  Administered 2017-03-13: 1 via ORAL
  Filled 2017-03-11: qty 1

## 2017-03-11 MED ORDER — HEPARIN SODIUM (PORCINE) 5000 UNIT/ML IJ SOLN
5000.0000 [IU] | Freq: Three times a day (TID) | INTRAMUSCULAR | Status: DC
  Administered 2017-03-11 – 2017-03-13 (×5): 5000 [IU] via SUBCUTANEOUS
  Filled 2017-03-11 (×5): qty 1

## 2017-03-11 MED ORDER — FENTANYL CITRATE (PF) 250 MCG/5ML IJ SOLN
INTRAMUSCULAR | Status: AC
  Filled 2017-03-11: qty 5

## 2017-03-11 MED ORDER — CHLORHEXIDINE GLUCONATE CLOTH 2 % EX PADS: 6.0000 | MEDICATED_PAD | Freq: Once | CUTANEOUS | Status: DC

## 2017-03-11 MED ORDER — ONDANSETRON HCL 4 MG/2ML IJ SOLN: 4.0000 mg | Freq: Once | INTRAMUSCULAR | Status: DC | PRN

## 2017-03-11 MED ORDER — FENTANYL CITRATE (PF) 250 MCG/5ML IJ SOLN
INTRAMUSCULAR | Status: AC
Start: 2017-03-11 — End: 2017-03-11
  Filled 2017-03-11: qty 5

## 2017-03-11 MED ORDER — MIDAZOLAM HCL 2 MG/2ML IJ SOLN
INTRAMUSCULAR | Status: AC
  Filled 2017-03-11: qty 2

## 2017-03-11 MED ORDER — LACTATED RINGERS IV SOLN
INTRAVENOUS | Status: DC | PRN
  Administered 2017-03-11 (×2): via INTRAVENOUS
  Administered 2017-03-11: 13:00:00

## 2017-03-11 MED ORDER — HYDRALAZINE HCL 20 MG/ML IJ SOLN: 10.0000 mg | INTRAMUSCULAR | Status: DC | PRN

## 2017-03-11 MED ORDER — PROPOFOL 10 MG/ML IV BOLUS
INTRAVENOUS | Status: DC | PRN
  Administered 2017-03-11: 160 mg via INTRAVENOUS

## 2017-03-11 MED ORDER — ROCURONIUM BROMIDE 50 MG/5ML IV SOSY
PREFILLED_SYRINGE | INTRAVENOUS | Status: AC
  Filled 2017-03-11: qty 5

## 2017-03-11 MED ORDER — DEXAMETHASONE SODIUM PHOSPHATE 10 MG/ML IJ SOLN
INTRAMUSCULAR | Status: DC | PRN
  Administered 2017-03-11: 10 mg via INTRAVENOUS

## 2017-03-11 MED ORDER — GABAPENTIN 300 MG PO CAPS
300.0000 mg | ORAL_CAPSULE | Freq: Two times a day (BID) | ORAL | Status: DC
  Administered 2017-03-11 – 2017-03-13 (×4): 300 mg via ORAL
  Filled 2017-03-11 (×4): qty 1

## 2017-03-11 MED ORDER — SUGAMMADEX SODIUM 200 MG/2ML IV SOLN
INTRAVENOUS | Status: DC | PRN
  Administered 2017-03-11: 150 mg via INTRAVENOUS

## 2017-03-11 MED ORDER — KETAMINE HCL 10 MG/ML IJ SOLN
INTRAMUSCULAR | Status: DC | PRN
  Administered 2017-03-11: 10 mg via INTRAVENOUS
  Administered 2017-03-11: 15 mg via INTRAVENOUS
  Administered 2017-03-11: 10 mg via INTRAVENOUS

## 2017-03-11 MED ORDER — INSULIN ASPART 100 UNIT/ML ~~LOC~~ SOLN
0.0000 [IU] | SUBCUTANEOUS | Status: DC
  Administered 2017-03-11 (×2): 3 [IU] via SUBCUTANEOUS
  Administered 2017-03-11: 5 [IU] via SUBCUTANEOUS
  Administered 2017-03-12: 3 [IU] via SUBCUTANEOUS
  Administered 2017-03-12: 2 [IU] via SUBCUTANEOUS
  Administered 2017-03-12: 3 [IU] via SUBCUTANEOUS
  Administered 2017-03-12 – 2017-03-13 (×5): 2 [IU] via SUBCUTANEOUS
  Administered 2017-03-13: 5 [IU] via SUBCUTANEOUS
  Administered 2017-03-13: 2 [IU] via SUBCUTANEOUS

## 2017-03-11 MED ORDER — MIDAZOLAM HCL 5 MG/5ML IJ SOLN
INTRAMUSCULAR | Status: DC | PRN
  Administered 2017-03-11: 2 mg via INTRAVENOUS

## 2017-03-11 MED ORDER — LIDOCAINE 2% (20 MG/ML) 5 ML SYRINGE
INTRAMUSCULAR | Status: AC
  Filled 2017-03-11: qty 5

## 2017-03-11 MED ORDER — ONDANSETRON HCL 4 MG/2ML IJ SOLN
INTRAMUSCULAR | Status: AC
  Filled 2017-03-11: qty 2

## 2017-03-11 MED ORDER — FENTANYL CITRATE (PF) 100 MCG/2ML IJ SOLN: 25.0000 ug | INTRAMUSCULAR | Status: DC | PRN

## 2017-03-11 MED ORDER — MEPERIDINE HCL 50 MG/ML IJ SOLN: 6.2500 mg | INTRAMUSCULAR | Status: DC | PRN

## 2017-03-11 MED ORDER — ONDANSETRON HCL 4 MG/2ML IJ SOLN: 4.0000 mg | Freq: Four times a day (QID) | INTRAMUSCULAR | Status: DC | PRN

## 2017-03-11 MED ORDER — LIDOCAINE 2% (20 MG/ML) 5 ML SYRINGE
INTRAMUSCULAR | Status: DC | PRN
  Administered 2017-03-11: 1.5 mg/kg/h via INTRAVENOUS

## 2017-03-11 MED ORDER — FENTANYL CITRATE (PF) 100 MCG/2ML IJ SOLN
INTRAMUSCULAR | Status: DC | PRN
  Administered 2017-03-11: 50 ug via INTRAVENOUS
  Administered 2017-03-11: 100 ug via INTRAVENOUS
  Administered 2017-03-11 (×2): 50 ug via INTRAVENOUS
  Administered 2017-03-11: 25 ug via INTRAVENOUS

## 2017-03-11 MED ORDER — LIDOCAINE HCL 2 % IJ SOLN
INTRAMUSCULAR | Status: AC
  Filled 2017-03-11: qty 20

## 2017-03-11 MED ORDER — CEFAZOLIN SODIUM-DEXTROSE 2-4 GM/100ML-% IV SOLN
2.0000 g | INTRAVENOUS | Status: AC
  Administered 2017-03-11: 2 g via INTRAVENOUS

## 2017-03-11 MED ORDER — CEFAZOLIN SODIUM-DEXTROSE 2-4 GM/100ML-% IV SOLN
INTRAVENOUS | Status: AC
  Filled 2017-03-11: qty 100

## 2017-03-11 MED ORDER — 0.9 % SODIUM CHLORIDE (POUR BTL) OPTIME
TOPICAL | Status: DC | PRN
  Administered 2017-03-11: 1000 mL

## 2017-03-11 MED ORDER — FENTANYL CITRATE (PF) 100 MCG/2ML IJ SOLN
INTRAMUSCULAR | Status: AC
  Filled 2017-03-11: qty 4

## 2017-03-11 MED ORDER — PROPOFOL 10 MG/ML IV BOLUS
INTRAVENOUS | Status: AC
  Filled 2017-03-11: qty 20

## 2017-03-11 MED ORDER — DEXAMETHASONE SODIUM PHOSPHATE 10 MG/ML IJ SOLN
INTRAMUSCULAR | Status: AC
  Filled 2017-03-11: qty 1

## 2017-03-11 MED ORDER — CELECOXIB 200 MG PO CAPS
200.0000 mg | ORAL_CAPSULE | Freq: Two times a day (BID) | ORAL | Status: DC
  Administered 2017-03-11 – 2017-03-13 (×4): 200 mg via ORAL
  Filled 2017-03-11 (×4): qty 1

## 2017-03-11 MED ORDER — ROCURONIUM BROMIDE 100 MG/10ML IV SOLN
INTRAVENOUS | Status: DC | PRN
  Administered 2017-03-11: 10 mg via INTRAVENOUS
  Administered 2017-03-11: 20 mg via INTRAVENOUS
  Administered 2017-03-11 (×2): 10 mg via INTRAVENOUS
  Administered 2017-03-11: 50 mg via INTRAVENOUS
  Administered 2017-03-11 (×2): 20 mg via INTRAVENOUS
  Administered 2017-03-11 (×2): 10 mg via INTRAVENOUS

## 2017-03-11 MED ORDER — ONDANSETRON HCL 4 MG/2ML IJ SOLN
INTRAMUSCULAR | Status: DC | PRN
  Administered 2017-03-11: 4 mg via INTRAVENOUS

## 2017-03-11 MED ORDER — HEPARIN SODIUM (PORCINE) 5000 UNIT/ML IJ SOLN
5000.0000 [IU] | Freq: Once | INTRAMUSCULAR | Status: AC
  Administered 2017-03-11: 5000 [IU] via SUBCUTANEOUS
  Filled 2017-03-11: qty 1

## 2017-03-11 MED ORDER — LACTATED RINGERS IR SOLN
Status: DC | PRN
  Administered 2017-03-11: 1000 mL

## 2017-03-11 MED ORDER — HYDROMORPHONE HCL 1 MG/ML IJ SOLN
0.5000 mg | INTRAMUSCULAR | Status: DC | PRN
  Administered 2017-03-11 – 2017-03-13 (×8): 0.5 mg via INTRAVENOUS
  Filled 2017-03-11 (×8): qty 0.5

## 2017-03-11 MED ORDER — CELECOXIB 200 MG PO CAPS
200.0000 mg | ORAL_CAPSULE | ORAL | Status: AC
  Administered 2017-03-11: 200 mg via ORAL
  Filled 2017-03-11: qty 1

## 2017-03-11 MED ORDER — ACETAMINOPHEN 500 MG PO TABS
1000.0000 mg | ORAL_TABLET | ORAL | Status: AC
  Administered 2017-03-11: 1000 mg via ORAL
  Filled 2017-03-11: qty 2

## 2017-03-11 MED ORDER — KCL IN DEXTROSE-NACL 20-5-0.45 MEQ/L-%-% IV SOLN
INTRAVENOUS | Status: DC
  Administered 2017-03-11 – 2017-03-12 (×3): via INTRAVENOUS
  Administered 2017-03-13: 100 mL/h via INTRAVENOUS
  Filled 2017-03-11 (×5): qty 1000

## 2017-03-11 MED ORDER — KETAMINE HCL 10 MG/ML IJ SOLN
INTRAMUSCULAR | Status: AC
  Filled 2017-03-11: qty 1

## 2017-03-11 MED ORDER — GABAPENTIN 300 MG PO CAPS
300.0000 mg | ORAL_CAPSULE | ORAL | Status: AC
  Administered 2017-03-11: 300 mg via ORAL
  Filled 2017-03-11: qty 1

## 2017-03-11 MED ORDER — SODIUM CHLORIDE 0.9 % IJ SOLN
INTRAMUSCULAR | Status: AC
  Filled 2017-03-11: qty 10

## 2017-03-11 MED ORDER — PROCHLORPERAZINE MALEATE 10 MG PO TABS: 10.0000 mg | ORAL_TABLET | Freq: Four times a day (QID) | ORAL | Status: DC | PRN

## 2017-03-11 MED ORDER — BUPIVACAINE LIPOSOME 1.3 % IJ SUSP
20.0000 mL | Freq: Once | INTRAMUSCULAR | Status: AC
  Administered 2017-03-11: 20 mL
  Filled 2017-03-11: qty 20

## 2017-03-11 MED ORDER — SUGAMMADEX SODIUM 200 MG/2ML IV SOLN
INTRAVENOUS | Status: AC
  Filled 2017-03-11: qty 2

## 2017-03-11 MED ORDER — SODIUM CHLORIDE 0.9 % IJ SOLN
INTRAMUSCULAR | Status: DC | PRN
  Administered 2017-03-11: 10 mL via INTRAVENOUS

## 2017-03-11 MED ORDER — FENTANYL CITRATE (PF) 100 MCG/2ML IJ SOLN
25.0000 ug | INTRAMUSCULAR | Status: AC | PRN
  Administered 2017-03-11: 50 ug via INTRAVENOUS
  Administered 2017-03-11 (×2): 25 ug via INTRAVENOUS
  Administered 2017-03-11: 50 ug via INTRAVENOUS
  Administered 2017-03-11 (×2): 25 ug via INTRAVENOUS

## 2017-03-11 MED ORDER — INSULIN ASPART 100 UNIT/ML ~~LOC~~ SOLN
SUBCUTANEOUS | Status: AC
  Filled 2017-03-11: qty 1

## 2017-03-11 MED ORDER — PROCHLORPERAZINE EDISYLATE 5 MG/ML IJ SOLN: 5.0000 mg | Freq: Four times a day (QID) | INTRAMUSCULAR | Status: DC | PRN

## 2017-03-11 MED ORDER — ONDANSETRON 4 MG PO TBDP
4.0000 mg | ORAL_TABLET | Freq: Four times a day (QID) | ORAL | Status: DC | PRN
Start: 2017-03-11 — End: 2017-03-13

## 2017-03-11 MED ORDER — CEFAZOLIN SODIUM-DEXTROSE 2-4 GM/100ML-% IV SOLN
2.0000 g | Freq: Three times a day (TID) | INTRAVENOUS | Status: AC
  Administered 2017-03-11: 2 g via INTRAVENOUS
  Filled 2017-03-11: qty 100

## 2017-03-11 SURGICAL SUPPLY — 49 items
ADH SKN CLS APL DERMABOND .7 (GAUZE/BANDAGES/DRESSINGS) ×1
APPLIER CLIP ROT 10 11.4 M/L (STAPLE)
APR CLP MED LRG 11.4X10 (STAPLE)
CABLE HIGH FREQUENCY MONO STRZ (ELECTRODE) ×3 IMPLANT
CLIP APPLIE ROT 10 11.4 M/L (STAPLE) IMPLANT
COVER SURGICAL LIGHT HANDLE (MISCELLANEOUS) ×3 IMPLANT
DECANTER SPIKE VIAL GLASS SM (MISCELLANEOUS) ×3 IMPLANT
DERMABOND ADVANCED (GAUZE/BANDAGES/DRESSINGS) ×2
DERMABOND ADVANCED .7 DNX12 (GAUZE/BANDAGES/DRESSINGS) ×1 IMPLANT
DEVICE SUT QUICK LOAD TK 5 (STAPLE) ×12 IMPLANT
DEVICE SUT TI-KNOT TK 5X26 (MISCELLANEOUS) ×2 IMPLANT
DEVICE SUTURE ENDOST 10MM (ENDOMECHANICALS) ×3 IMPLANT
DEVICE TI KNOT TK5 (MISCELLANEOUS) ×1
DISSECTOR BLUNT TIP ENDO 5MM (MISCELLANEOUS) ×3 IMPLANT
DRAIN PENROSE 18X1/2 LTX STRL (DRAIN) ×3 IMPLANT
DRAPE LAPAROSCOPIC ABDOMINAL (DRAPES) ×3 IMPLANT
DRAPE SHEET LG 3/4 BI-LAMINATE (DRAPES) ×2 IMPLANT
ELECT REM PT RETURN 15FT ADLT (MISCELLANEOUS) ×3 IMPLANT
GLOVE BIOGEL M 8.0 STRL (GLOVE) ×3 IMPLANT
GOWN STRL REUS W/TWL XL LVL3 (GOWN DISPOSABLE) ×9 IMPLANT
GRASPER ENDO BABCOCK 10 (MISCELLANEOUS) ×1 IMPLANT
GRASPER ENDO BABCOCK 10MM (MISCELLANEOUS) ×3
KIT BASIN OR (CUSTOM PROCEDURE TRAY) ×3 IMPLANT
PAD POSITIONING PINK XL (MISCELLANEOUS) ×2 IMPLANT
POSITIONER SURGICAL ARM (MISCELLANEOUS) IMPLANT
QUICK LOAD TK 5 (STAPLE) ×7
RELOAD ENDO STITCH (ENDOMECHANICALS) ×9 IMPLANT
RELOAD SUT TRIPLE-STITCH 2-0 (ENDOMECHANICALS) ×3 IMPLANT
SCISSORS LAP 5X45 EPIX DISP (ENDOMECHANICALS) ×3 IMPLANT
SET IRRIG TUBING LAPAROSCOPIC (IRRIGATION / IRRIGATOR) ×3 IMPLANT
SHEARS HARMONIC ACE PLUS 45CM (MISCELLANEOUS) ×3 IMPLANT
SLEEVE ADV FIXATION 5X100MM (TROCAR) ×10 IMPLANT
STAPLER VISISTAT 35W (STAPLE) ×3 IMPLANT
SUT SURGIDAC NAB ES-9 0 48 120 (SUTURE) ×11 IMPLANT
SUT VIC AB 4-0 SH 18 (SUTURE) ×3 IMPLANT
TAPE CLOTH 4X10 WHT NS (GAUZE/BANDAGES/DRESSINGS) ×3 IMPLANT
TIP INNERVISION DETACH 40FR (MISCELLANEOUS) IMPLANT
TIP INNERVISION DETACH 50FR (MISCELLANEOUS) IMPLANT
TIP INNERVISION DETACH 56FR (MISCELLANEOUS) IMPLANT
TIPS INNERVISION DETACH 40FR (MISCELLANEOUS)
TOWEL NATURAL 10PK STERILE (DISPOSABLE) ×2 IMPLANT
TOWEL OR 17X26 10 PK STRL BLUE (TOWEL DISPOSABLE) ×6 IMPLANT
TOWEL OR NON WOVEN STRL DISP B (DISPOSABLE) ×3 IMPLANT
TRAY FOLEY W/METER SILVER 16FR (SET/KITS/TRAYS/PACK) ×2 IMPLANT
TRAY LAPAROSCOPIC (CUSTOM PROCEDURE TRAY) ×3 IMPLANT
TROCAR ADV FIXATION 11X100MM (TROCAR) ×3 IMPLANT
TROCAR ADV FIXATION 5X100MM (TROCAR) ×3 IMPLANT
TROCAR BLADELESS OPT 5 100 (ENDOMECHANICALS) ×1 IMPLANT
TUBING INSUF HEATED (TUBING) ×3 IMPLANT

## 2017-03-11 NOTE — Transfer of Care (Signed)
Immediate Anesthesia Transfer of Care Note  Patient: Alexander Chambers  Procedure(s) Performed: LAPAROSCOPIC REDO NISSEN FUNDOPLICATION, UPPER ENDOSCOPY (N/A Abdomen)  Patient Location: PACU  Anesthesia Type:General  Level of Consciousness: awake, alert  and oriented  Airway & Oxygen Therapy: Patient Spontanous Breathing and Patient connected to face mask oxygen  Post-op Assessment: Report given to RN and Post -op Vital signs reviewed and stable  Post vital signs: Reviewed and stable  Last Vitals:  Vitals:   03/11/17 0600  BP: 135/86  Pulse: 87  Resp: 18  Temp: 36.6 C  SpO2: 96%    Last Pain:  Vitals:   03/11/17 0600  TempSrc: Oral      Patients Stated Pain Goal: 4 (11/11/21 3612)  Complications: No apparent anesthesia complications

## 2017-03-11 NOTE — Interval H&P Note (Signed)
History and Physical Interval Note:  03/11/2017 7:37 AM  Alexander Chambers  has presented today for surgery, with the diagnosis of RECURRENT GERD  The various methods of treatment have been discussed with the patient and family. After consideration of risks, benefits and other options for treatment, the patient has consented to  Procedure(s): LAPAROSCOPIC REDO NISSEN FUNDOPLICATION (N/A) as a surgical intervention .  The patient's history has been reviewed, patient examined, no change in status, stable for surgery.  I have reviewed the patient's chart and labs.  Questions were answered to the patient's satisfaction.     Pedro Earls

## 2017-03-11 NOTE — Anesthesia Postprocedure Evaluation (Signed)
Anesthesia Post Note  Patient: OAKLYN MANS  Procedure(s) Performed: LAPAROSCOPIC REDO NISSEN FUNDOPLICATION, UPPER ENDOSCOPY (N/A Abdomen)     Patient location during evaluation: PACU Anesthesia Type: General Level of consciousness: awake and alert Pain management: pain level controlled Vital Signs Assessment: post-procedure vital signs reviewed and stable Respiratory status: spontaneous breathing, nonlabored ventilation, respiratory function stable and patient connected to nasal cannula oxygen Cardiovascular status: blood pressure returned to baseline and stable Postop Assessment: no apparent nausea or vomiting Anesthetic complications: no    Last Vitals:  Vitals:   03/11/17 0600 03/11/17 1200  BP: 135/86 134/86  Pulse: 87 98  Resp: 18 (!) 27  Temp: 36.6 C 37.1 C  SpO2: 96% 99%    Last Pain:  Vitals:   03/11/17 1200  TempSrc:   PainSc: 0-No pain                 Velecia Ovitt

## 2017-03-11 NOTE — Anesthesia Procedure Notes (Signed)
Procedure Name: Intubation Date/Time: 03/11/2017 7:49 AM Performed by: Glory Buff, CRNA Pre-anesthesia Checklist: Patient identified, Emergency Drugs available, Suction available and Patient being monitored Patient Re-evaluated:Patient Re-evaluated prior to induction Oxygen Delivery Method: Circle system utilized Preoxygenation: Pre-oxygenation with 100% oxygen Induction Type: IV induction Ventilation: Mask ventilation without difficulty Laryngoscope Size: Miller and 3 Grade View: Grade II Tube type: Oral Tube size: 7.5 mm Number of attempts: 1 Airway Equipment and Method: Stylet and Oral airway Placement Confirmation: ETT inserted through vocal cords under direct vision,  positive ETCO2 and breath sounds checked- equal and bilateral Secured at: 22 cm Tube secured with: Tape Dental Injury: Teeth and Oropharynx as per pre-operative assessment

## 2017-03-11 NOTE — Op Note (Signed)
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  After anesthesia was administered and the patient was prepped a timeout was performed.  Access to the abdomen was achieved through the left upper quadrant with a 5 mm Optiview without difficulty.  A second 5 mm placed on the left side and there were numerous dense adhesions in the upper midline.  Began with sharp dissection taking these down from the midline and found a portion of the middle above the umbilicus it was very densely adherent.  As I dissected my way through this and encountered a large piece of Marlex type mesh.  Carefully remove this from the omental tissue and once this was brought down I had more of a free range of looking at the abdomen and the forgot.  I placed a Nathanson retractor beneath the left lateral segment.  I inspected the liver and it appeared to be otherwise healthy in the gallbladder with known gallstones was robin's egg blue and did not appear to have any evidence of cholecystitis.  The prior hernia was stuck to the left lateral segment posteriorly and this was taken down with careful dissection using scissors and occasionally the harmonic scalpel.  Then was able to go up and found the diaphragm on the left side and reduce the herniated stomach and then began taking down portions of the sac.  This was then begun over on the the right crus and I went up into the  mediastinum from that side and again freed up the sac.  Careful dissection was includes and carried both anteriorly and posteriorly completely freeing the wrap which appeared to be intact and bring all that down.  I got behind this with a Penrose drain and held that while I further dissected the mediastinum.  At that point I went above and performed upper endoscopy with the Pentax scope passing this down the esophagus easily.  At the GE junction there were some strands of what could be early Barrett's esophagus no evidence of malignancy.  The eye was then watching this and look for bubbles and no bubbles were seen and there was no evidence of any breach in the esophagus or stomach.  The wrap appeared to be nice symmetrically in place and did not appear to be too tight.  I got into the stomach and retroflexed and looked at everything from below and again the wrap appeared to be in place.  The esophagus and stomach was not under and continue any tension at this point and I elected to then closed the hiatus first with some with figure-of-eight type suture tied and then with a tie knot.  This is followed by a simple suture just with a Ty Knot followed by a third horizontal mattress with a tie and a tie knot and finally a simple suture with a Ty Knot.  When completed the wrap appeared to be intact and was below the  diaphragm.  The diaphragmatic closure look good and looks stenotic without being too tight.  I injected the area with Exparel all the incisions and closed the 12 mm port which was slight to the right of midline with an Endo Close and 0 vicryl.    Incisions were closed with 4-0 Vicryl and Dermabond.  Patient tolerated procedure well was taken recovery room in satisfactory condition.  The patient tolerated the procedure well and was taken to the PACU in stable condition.     [name], [place], [facility], [place]

## 2017-03-12 ENCOUNTER — Encounter (HOSPITAL_COMMUNITY): Payer: Self-pay | Admitting: Surgery

## 2017-03-12 ENCOUNTER — Observation Stay (HOSPITAL_COMMUNITY): Payer: Commercial Managed Care - PPO

## 2017-03-12 DIAGNOSIS — R1310 Dysphagia, unspecified: Secondary | ICD-10-CM | POA: Diagnosis not present

## 2017-03-12 DIAGNOSIS — T85898A Other specified complication of other internal prosthetic devices, implants and grafts, initial encounter: Secondary | ICD-10-CM | POA: Diagnosis not present

## 2017-03-12 DIAGNOSIS — K449 Diaphragmatic hernia without obstruction or gangrene: Secondary | ICD-10-CM | POA: Diagnosis not present

## 2017-03-12 LAB — GLUCOSE, CAPILLARY
GLUCOSE-CAPILLARY: 136 mg/dL — AB (ref 65–99)
GLUCOSE-CAPILLARY: 145 mg/dL — AB (ref 65–99)
GLUCOSE-CAPILLARY: 185 mg/dL — AB (ref 65–99)
Glucose-Capillary: 138 mg/dL — ABNORMAL HIGH (ref 65–99)
Glucose-Capillary: 138 mg/dL — ABNORMAL HIGH (ref 65–99)
Glucose-Capillary: 147 mg/dL — ABNORMAL HIGH (ref 65–99)
Glucose-Capillary: 165 mg/dL — ABNORMAL HIGH (ref 65–99)

## 2017-03-12 LAB — CBC
HCT: 41.1 % (ref 39.0–52.0)
Hemoglobin: 13.3 g/dL (ref 13.0–17.0)
MCH: 30 pg (ref 26.0–34.0)
MCHC: 32.4 g/dL (ref 30.0–36.0)
MCV: 92.6 fL (ref 78.0–100.0)
PLATELETS: 174 10*3/uL (ref 150–400)
RBC: 4.44 MIL/uL (ref 4.22–5.81)
RDW: 14.2 % (ref 11.5–15.5)
WBC: 10 10*3/uL (ref 4.0–10.5)

## 2017-03-12 LAB — BASIC METABOLIC PANEL
ANION GAP: 6 (ref 5–15)
BUN: 18 mg/dL (ref 6–20)
CALCIUM: 7.8 mg/dL — AB (ref 8.9–10.3)
CO2: 27 mmol/L (ref 22–32)
CREATININE: 0.98 mg/dL (ref 0.61–1.24)
Chloride: 104 mmol/L (ref 101–111)
GLUCOSE: 141 mg/dL — AB (ref 65–99)
Potassium: 3.6 mmol/L (ref 3.5–5.1)
Sodium: 137 mmol/L (ref 135–145)

## 2017-03-12 MED ORDER — IOPAMIDOL (ISOVUE-300) INJECTION 61%
INTRAVENOUS | Status: AC
Start: 2017-03-12 — End: 2017-03-12
  Administered 2017-03-12: 10:00:00 via ORAL
  Filled 2017-03-12: qty 150

## 2017-03-12 NOTE — Progress Notes (Signed)
Patient ID: Alexander Chambers, male   DOB: 1955-08-01, 61 y.o.   MRN: [id number] [facility] Progress Note:   1 Day Post-Op  Subjective: Mental status is clear and without complaints Objective: Vital signs in last 24 hours: Temp:  [98 F (36.7 C)-98.8 F (37.1 C)] 98 F (36.7 C) (12/01 0530) Pulse Rate:  [73-98] 76 (12/01 0530) Resp:  [13-27] 18 (12/01 0530) BP: (110-142)/(78-93) 119/81 (12/01 0530) SpO2:  [94 %-100 %] 94 % (12/01 0530)  Intake/Output from previous day: 11/30 0701 - 12/01 0700 In: 3459.3 [P.O.:110; I.V.:3249.3; IV Piggyback:100] Out: 0938 [Urine:1775; Blood:50] Intake/Output this shift: No intake/output data recorded.  Physical Exam: Work of breathing is normal. No complaints  Lab Results:  Results for orders placed or performed during the hospital encounter of 03/11/17 (from the past 48 hour(s))  Glucose, capillary     Status: Abnormal   Collection Time: 03/11/17  6:00 AM  Result Value Ref Range   Glucose-Capillary 244 (H) 65 - 99 mg/dL   Comment 1 Notify RN    Comment 2 Document in Chart   Glucose, capillary     Status: Abnormal   Collection Time: 03/11/17 12:20 PM  Result Value Ref Range   Glucose-Capillary 238 (H) 65 - 99 mg/dL   Comment 1 Notify RN    Comment 2 Document in Chart   Glucose, capillary     Status: Abnormal   Collection Time: 03/11/17  1:09 PM  Result Value Ref Range   Glucose-Capillary 200 (H) 65 - 99 mg/dL   Comment 1 Notify RN   CBC     Status: Abnormal   Collection Time: 03/11/17  2:43 PM  Result Value Ref Range   WBC 14.2 (H) 4.0 - 10.5 K/uL   RBC 4.87 4.22 - 5.81 MIL/uL   Hemoglobin 14.7 13.0 - 17.0 g/dL   HCT 44.7 39.0 - 52.0 %   MCV 91.8 78.0 - 100.0 fL   MCH 30.2 26.0 - 34.0 pg   MCHC 32.9 30.0 - 36.0 g/dL   RDW 14.0 11.5 - 15.5 %   Platelets 199 150 - 400 K/uL  Creatinine, serum     Status: None   Collection Time: 03/11/17  2:43 PM  Result Value Ref Range   Creatinine, Ser 1.15 0.61 - 1.24 mg/dL   GFR  calc non Af Amer >60 >60 mL/min   GFR calc Af Amer >60 >60 mL/min    Comment: (NOTE) The eGFR has been calculated using the CKD EPI equation. This calculation has not been validated in all clinical situations. eGFR's persistently <60 mL/min signify possible Chronic Kidney Disease.   Glucose, capillary     Status: Abnormal   Collection Time: 03/11/17  4:20 PM  Result Value Ref Range   Glucose-Capillary 189 (H) 65 - 99 mg/dL  Glucose, capillary     Status: Abnormal   Collection Time: 03/11/17  7:48 PM  Result Value Ref Range   Glucose-Capillary 159 (H) 65 - 99 mg/dL  Glucose, capillary     Status: Abnormal   Collection Time: 03/12/17 12:04 AM  Result Value Ref Range   Glucose-Capillary 138 (H) 65 - 99 mg/dL  Glucose, capillary     Status: Abnormal   Collection Time: 03/12/17  3:50 AM  Result Value Ref Range   Glucose-Capillary 138 (H) 65 - 99 mg/dL  CBC     Status: None   Collection Time: 03/12/17  4:45 AM  Result Value Ref Range   WBC 10.0 4.0 -  10.5 K/uL   RBC 4.44 4.22 - 5.81 MIL/uL   Hemoglobin 13.3 13.0 - 17.0 g/dL   HCT 41.1 39.0 - 52.0 %   MCV 92.6 78.0 - 100.0 fL   MCH 30.0 26.0 - 34.0 pg   MCHC 32.4 30.0 - 36.0 g/dL   RDW 14.2 11.5 - 15.5 %   Platelets 174 150 - 400 K/uL  Basic metabolic panel     Status: Abnormal   Collection Time: 03/12/17  4:45 AM  Result Value Ref Range   Sodium 137 135 - 145 mmol/L   Potassium 3.6 3.5 - 5.1 mmol/L   Chloride 104 101 - 111 mmol/L   CO2 27 22 - 32 mmol/L   Glucose, Bld 141 (H) 65 - 99 mg/dL   BUN 18 6 - 20 mg/dL   Creatinine, Ser 0.98 0.61 - 1.24 mg/dL   Calcium 7.8 (L) 8.9 - 10.3 mg/dL   GFR calc non Af Amer >60 >60 mL/min   GFR calc Af Amer >60 >60 mL/min    Comment: (NOTE) The eGFR has been calculated using the CKD EPI equation. This calculation has not been validated in all clinical situations. eGFR's persistently <60 mL/min signify possible Chronic Kidney Disease.    Anion gap 6 5 - 15  Glucose, capillary      Status: Abnormal   Collection Time: 03/12/17  7:42 AM  Result Value Ref Range   Glucose-Capillary 136 (H) 65 - 99 mg/dL    Radiology/Results: No results found.  Anti-infectives: Anti-infectives (From admission, onward)   Start     Dose/Rate Route Frequency Ordered Stop   03/11/17 1600  ceFAZolin (ANCEF) IVPB 2g/100 mL premix     2 g 200 mL/hr over 30 Minutes Intravenous Every 8 hours 03/11/17 1343 03/11/17 1600   03/11/17 0648  ceFAZolin (ANCEF) 2-4 GM/100ML-% IVPB    Comments:  Chrystine Oiler: cabinet override      03/11/17 0648 03/11/17 0750   03/11/17 0555  ceFAZolin (ANCEF) IVPB 2g/100 mL premix     2 g 200 mL/hr over 30 Minutes Intravenous On call to O.R. 03/11/17 0555 03/11/17 0820      Assessment/Plan: Problem List: Patient Active Problem List   Diagnosis Date Noted  . Status post Nissen fundoplication 10/21/1973  . SVT (supraventricular tachycardia) ([place]) 09/14/2011  . HYPERLIPIDEMIA 03/24/2010  . HYPERTENSION 03/24/2010  . GASTRITIS 03/24/2010  . HIATAL HERNIA 03/24/2010  . DIARRHEA 03/24/2010  . DM 03/02/2010  . GERD 03/02/2010  . BARRETT'S ESOPHAGUS 08/17/2007  . DIVERTICULOSIS-COLON 08/17/2007  . IRRITABLE BOWEL SYNDROME 08/17/2007    Awaiting water contrast swallow before beginning diet.  Hopeful discharge tomorrow.   1 Day Post-Op    LOS: 1 day   [name], MD, [facility] [phone] beeper [phone]  03/12/2017 9:32 AM

## 2017-03-13 DIAGNOSIS — R1310 Dysphagia, unspecified: Secondary | ICD-10-CM | POA: Diagnosis not present

## 2017-03-13 DIAGNOSIS — T85898A Other specified complication of other internal prosthetic devices, implants and grafts, initial encounter: Secondary | ICD-10-CM | POA: Diagnosis not present

## 2017-03-13 DIAGNOSIS — K449 Diaphragmatic hernia without obstruction or gangrene: Secondary | ICD-10-CM | POA: Diagnosis not present

## 2017-03-13 LAB — BASIC METABOLIC PANEL
ANION GAP: 8 (ref 5–15)
BUN: 10 mg/dL (ref 6–20)
CHLORIDE: 102 mmol/L (ref 101–111)
CO2: 26 mmol/L (ref 22–32)
Calcium: 8 mg/dL — ABNORMAL LOW (ref 8.9–10.3)
Creatinine, Ser: 0.9 mg/dL (ref 0.61–1.24)
Glucose, Bld: 151 mg/dL — ABNORMAL HIGH (ref 65–99)
POTASSIUM: 4 mmol/L (ref 3.5–5.1)
SODIUM: 136 mmol/L (ref 135–145)

## 2017-03-13 LAB — CBC
HCT: 39.3 % (ref 39.0–52.0)
HEMOGLOBIN: 12.9 g/dL — AB (ref 13.0–17.0)
MCH: 30.6 pg (ref 26.0–34.0)
MCHC: 32.8 g/dL (ref 30.0–36.0)
MCV: 93.3 fL (ref 78.0–100.0)
PLATELETS: 146 10*3/uL — AB (ref 150–400)
RBC: 4.21 MIL/uL — AB (ref 4.22–5.81)
RDW: 14.4 % (ref 11.5–15.5)
WBC: 7.3 10*3/uL (ref 4.0–10.5)

## 2017-03-13 LAB — GLUCOSE, CAPILLARY
GLUCOSE-CAPILLARY: 150 mg/dL — AB (ref 65–99)
GLUCOSE-CAPILLARY: 212 mg/dL — AB (ref 65–99)

## 2017-03-13 MED ORDER — HYDROCODONE-ACETAMINOPHEN 5-325 MG PO TABS: 1.0000 | ORAL_TABLET | ORAL | 0 refills | Status: DC | PRN

## 2017-03-13 NOTE — Discharge Summary (Signed)
Physician Discharge Summary  Patient ID: Alexander Chambers MRN: [id number] DOB/AGE: [birth year] 61 y.o.  Admit date: 03/11/2017 Discharge date: 03/13/2017  Admission Diagnoses:  Dysphagia --Nissen 20+ years ago  Discharge Diagnoses:  Post repair of hiatal hernia  Active Problems:   Status post Nissen fundoplication   Surgery:  Laparoscopic repair of hiatal hernia and repair Nissen fundoplication  Discharged Condition: improved  Hospital Course:   Had surgery on Friday.  Swallow on Sat OK.  Started liquids and ready for discharge on Sunday  Consults: none  Significant Diagnostic Studies: UGI    Discharge Exam: Blood pressure 116/74, pulse 74, temperature 98.5 F (36.9 C), temperature source Oral, resp. rate 16, height 5\' 8"  (1.727 m), weight 73.5 kg (162 lb), SpO2 96 %. Incisions ok.  Disposition: 01-Home or Self Care   Allergies as of 03/13/2017   No Known Allergies     Medication List    TAKE these medications   aspirin EC 81 MG tablet Take 81 mg by mouth daily.   buPROPion 150 MG 24 hr tablet Commonly known as:  WELLBUTRIN XL Take 150 mg daily by mouth.   glipiZIDE 10 MG tablet Commonly known as:  GLUCOTROL Take 10 mg by mouth 2 (two) times daily before a meal.   GLYXAMBI 25-5 MG Tabs Generic drug:  Empagliflozin-Linagliptin Take 1 tablet by mouth daily.   HYDROcodone-acetaminophen 5-325 MG tablet Commonly known as:  NORCO/VICODIN Take 1 tablet by mouth every 4 (four) hours as needed for moderate pain.   lisinopril 5 MG tablet Commonly known as:  PRINIVIL,ZESTRIL Take 5 mg by mouth daily.   loratadine 10 MG tablet Commonly known as:  CLARITIN Take 10 mg by mouth daily.   metFORMIN 500 MG tablet Commonly known as:  GLUCOPHAGE Take 500-1,000 mg See admin instructions by mouth. Take 1000 mg in the morning and 500 mg in the evening   multivitamin capsule Take 1 capsule by mouth daily.   pantoprazole 40 MG tablet Commonly known as:  PROTONIX Take  1 tablet (40 mg total) 2 (two) times daily before a meal by mouth.   simvastatin 80 MG tablet Commonly known as:  ZOCOR Take 40 mg by mouth at bedtime.      Follow-up Information    Johnathan Hausen, MD. Schedule an appointment as soon as possible for a visit in 4 week(s).   Specialty:  General Surgery Contact information: [address] [place] [phone]           Signed: Pedro Earls 03/13/2017, 9:28 AM

## 2017-03-13 NOTE — Progress Notes (Signed)
Patient voiding- walking and tolerating full liquid diet, passing flatus and pain is controlled.  Discharge instructions discussed until no further questions ask.

## 2017-03-13 NOTE — Discharge Instructions (Signed)
Laparoscopic Nissen Fundoplication, Care After °Refer to this sheet in the next few weeks. These instructions provide you with information about caring for yourself after your procedure. Your health care provider may also give you more specific instructions. Your treatment has been planned according to current medical practices, but problems sometimes occur. Call your health care provider if you have any problems or questions after your procedure. °What can I expect after the procedure? °After the procedure, it is common to have: °· Difficulty swallowing (dysphagia). °· Excess gas (bloating). ° °Follow these instructions at home: °Medicines °· Take medicines only as directed by your health care provider. °· Do not drive or operate heavy machinery while taking pain medicine. °Incision care °· There are many different ways to close and cover an incision, including stitches (sutures), skin glue, and adhesive strips. Follow your health care provider's instructions about: °? Incision care. °? Bandage (dressing) changes and removal. °? Incision closure removal. °· Check your incision areas every day for signs of infection. Watch for: °? Redness, swelling, or pain. °? Fluid, blood, or pus. °· Do not take baths, swim, or use a hot tub until your health care provider approves. Take showers as directed by your health care provider. °Eating and drinking °· Follow your health care provider's instructions about eating. °? You may need to follow a liquid-only diet for 2 weeks, followed by a diet of soft foods for 2 weeks. °? You should return to your usual diet gradually. °· Drink enough fluid to keep your urine clear or pale yellow. °Activity °· Return to your normal activities as directed by your health care provider. Ask your health care provider what activities are safe for you. °· Avoid strenuous exercise. °· Do not lift anything that is heavier than 10 lb (4.5 kg). °· Ask your health care provider when you can: °? Return to  sexual activity. °? Drive. °? Go back to work. °Contact a health care provider if: °· You have a fever. °· Your pain gets worse or is not helped by medicine. °· You have frequent nausea or vomiting. °· You have continued abdominal bloating. °· You have an ongoing (persistent) cough. °· You have redness, swelling, or pain in any incision areas. °· You have fluid, blood, or pus coming from any incisions. °Get help right away if: °· You have trouble breathing. °· You are unable to swallow. °· You have persistent vomiting. °· You have blood in your vomit. °· You have severe abdominal pain. °This information is not intended to replace advice given to you by your health care provider. Make sure you discuss any questions you have with your health care provider. °Document Released: 11/20/2003 Document Revised: 09/04/2015 Document Reviewed: 11/28/2013 °Elsevier Interactive Patient Education © 2018 [employer] Inc[employer] ° °

## 2017-03-17 ENCOUNTER — Other Ambulatory Visit: Payer: Self-pay | Admitting: Internal Medicine

## 2017-03-17 DIAGNOSIS — K2270 Barrett's esophagus without dysplasia: Secondary | ICD-10-CM

## 2017-03-23 DIAGNOSIS — L309 Dermatitis, unspecified: Secondary | ICD-10-CM | POA: Diagnosis not present

## 2017-03-30 ENCOUNTER — Encounter: Payer: Self-pay | Admitting: Emergency Medicine

## 2017-03-30 ENCOUNTER — Emergency Department
Admission: EM | Admit: 2017-03-30 | Discharge: 2017-03-30 | Disposition: A | Discharge status: Home / Self Care | Payer: Commercial Managed Care - PPO | Attending: Emergency Medicine | Admitting: Emergency Medicine

## 2017-03-30 DIAGNOSIS — Z7984 Long term (current) use of oral hypoglycemic drugs: Secondary | ICD-10-CM | POA: Diagnosis not present

## 2017-03-30 DIAGNOSIS — L509 Urticaria, unspecified: Secondary | ICD-10-CM

## 2017-03-30 DIAGNOSIS — E119 Type 2 diabetes mellitus without complications: Secondary | ICD-10-CM | POA: Insufficient documentation

## 2017-03-30 DIAGNOSIS — Z79899 Other long term (current) drug therapy: Secondary | ICD-10-CM | POA: Insufficient documentation

## 2017-03-30 DIAGNOSIS — I10 Essential (primary) hypertension: Secondary | ICD-10-CM | POA: Insufficient documentation

## 2017-03-30 DIAGNOSIS — R21 Rash and other nonspecific skin eruption: Secondary | ICD-10-CM | POA: Diagnosis present

## 2017-03-30 DIAGNOSIS — Z7982 Long term (current) use of aspirin: Secondary | ICD-10-CM | POA: Insufficient documentation

## 2017-03-30 MED ORDER — METHYLPREDNISOLONE 4 MG PO TBPK: ORAL_TABLET | ORAL | 0 refills | Status: DC

## 2017-03-30 NOTE — ED Triage Notes (Addendum)
Patient presents to ED via POV from home with c/o rash noted over right flank, rash is red in color. Patient denies pain. Patient reports he had lower esophagus surgery a month ago and it started off as a bruise.

## 2017-03-30 NOTE — ED Provider Notes (Signed)
[facility] Emergency Department Provider Note  ____________________________________________   First MD Initiated Contact with Patient 03/30/17 1224     (approximate)  I have reviewed the triage vital signs and the nursing notes.   HISTORY  Chief Complaint Rash    HPI Alexander Chambers is a 61 y.o. male complains of a rash to the right lower flank area, it wraps around to his lower back, states he had a hernia reconstructed as he had a hernia repair several years ago, but that was back in November, he had bruising in the area to start with, now the area is red and itchy, he saw his regular doctor and they started him on doxycycline as a preventive for cellulitis, he has been putting hydrocortisone cream on the area, his wife was concerned and that is why he came to the emergency department today, he denies chest pain, shortness of breath or abdominal pain  Past Medical History:  Diagnosis Date  . Allergy   . Arthritis   . Barrett's esophagus   . Cholelithiasis   . Diarrhea   . Diverticulosis   . DM    type 2  . Dysrhythmia    SVT  . GASTRITIS   . GERD   . Hepatic steatosis   . HIATAL HERNIA   . History of hiatal hernia   . Irritable bowel syndrome     Patient Active Problem List   Diagnosis Date Noted  . Status post Nissen fundoplication 56/31/4970  . SVT (supraventricular tachycardia) ([place]) 09/14/2011  . HYPERLIPIDEMIA 03/24/2010  . HYPERTENSION 03/24/2010  . GASTRITIS 03/24/2010  . DIARRHEA 03/24/2010  . DM 03/02/2010  . GERD 03/02/2010  . BARRETT'S ESOPHAGUS 08/17/2007  . DIVERTICULOSIS-COLON 08/17/2007  . IRRITABLE BOWEL SYNDROME 08/17/2007    Past Surgical History:  Procedure Laterality Date  . CARPAL TUNNEL RELEASE Right   . ELBOW SURGERY Right   . ESOPHAGEAL MANOMETRY N/A 12/08/2016   Procedure: ESOPHAGEAL MANOMETRY (EM);  Surgeon: Jerene Bears, MD;  Location: [facility] ENDOSCOPY;  Service: Gastroenterology;  Laterality: N/A;  .  HERNIA REPAIR    . LAPAROSCOPIC NISSEN FUNDOPLICATION N/A 26/37/8588   Procedure: LAPAROSCOPIC REDO NISSEN FUNDOPLICATION, UPPER ENDOSCOPY;  Surgeon: Johnathan Hausen, MD;  Location: [facility] ORS;  Service: General;  Laterality: N/A;  . SUPRAVENTRICULAR TACHYCARDIA ABLATION N/A 10/13/2011   Procedure: SUPRAVENTRICULAR TACHYCARDIA ABLATION;  Surgeon: Evans Lance, MD;  Location[facility] C[facility] LAB;  Service: Cardiovascular;  Laterality: N/A;  . THORACIC DISC SURGERY    . UPPER GASTROINTESTINAL ENDOSCOPY      Prior to Admission medications   Medication Sig Start Date End Date Taking? Authorizing Provider  aspirin EC 81 MG tablet Take 81 mg by mouth daily.    [provider]  buPROPion (WELLBUTRIN XL) 150 MG 24 hr tablet Take 150 mg daily by mouth.    [provider]  Empagliflozin-Linagliptin (GLYXAMBI) 25-5 MG TABS Take 1 tablet by mouth daily.    [provider]  glipiZIDE (GLUCOTROL) 10 MG tablet Take 10 mg by mouth 2 (two) times daily before a meal.    [provider]  HYDROcodone-acetaminophen (NORCO/VICODIN) 5-325 MG tablet Take 1 tablet by mouth every 4 (four) hours as needed for moderate pain. 03/13/17   Johnathan Hausen, MD  lisinopril (PRINIVIL,ZESTRIL) 5 MG tablet Take 5 mg by mouth daily.    [provider]  loratadine (CLARITIN) 10 MG tablet Take 10 mg by mouth daily.    [provider]  metFORMIN (  GLUCOPHAGE) 500 MG tablet Take 500-1,000 mg See admin instructions by mouth. Take 1000 mg in the morning and 500 mg in the evening    [provider]  methylPREDNISolone (MEDROL DOSEPAK) 4 MG TBPK tablet Take 6 pills on day one then decrease by 1 pill each day 03/30/17   Versie Starks, PA-C  Multiple Vitamin (MULTIVITAMIN) capsule Take 1 capsule by mouth daily.    [provider]  pantoprazole (PROTONIX) 40 MG tablet Take 1 tablet (40 mg total) 2 (two) times daily before a meal by mouth. 02/28/17   Pyrtle, Lajuan Lines, MD  simvastatin  (ZOCOR) 80 MG tablet Take 40 mg by mouth at bedtime.    [provider]    Allergies Patient has no known allergies.  Family History  Problem Relation Age of Onset  . Diabetes Father   . Lung cancer Father   . Heart failure Mother   . Colon cancer Neg Hx   . Esophageal cancer Neg Hx   . Stomach cancer Neg Hx   . Rectal cancer Neg Hx     Social History Social History   Tobacco Use  . Smoking status: Never Smoker  . Smokeless tobacco: Never Used  Substance Use Topics  . Alcohol use: Yes    Comment: DAILY- 2 glasses of wine  . Drug use: No    Review of Systems  Constitutional: No fever/chills Eyes: No visual changes. ENT: No sore throat. Respiratory: Denies cough Genitourinary: Negative for dysuria. Musculoskeletal: Negative for back pain. Skin: Positive for rash.    ____________________________________________   PHYSICAL EXAM:  VITAL SIGNS: ED Triage Vitals  Enc Vitals Group     BP 03/30/17 1209 119/79     Pulse Rate 03/30/17 1209 81     Resp 03/30/17 1209 15     Temp 03/30/17 1209 98.2 F (36.8 C)     Temp Source 03/30/17 1209 Oral     SpO2 03/30/17 1209 97 %     Weight 03/30/17 1209 149 lb (67.6 kg)     Height 03/30/17 1209 5\' 9"  (1.753 m)     Head Circumference --      Peak Flow --      Pain Score 03/30/17 1313 3     Pain Loc --      Pain Edu? --      Excl. in [place]? --     Constitutional: Alert and oriented. Well appearing and in no acute distress. Eyes: Conjunctivae are normal.  Head: Atraumatic. Nose: No congestion/rhinnorhea.  Cardiovascular: Normal rate, regular rhythm.  Heart sounds are normal Respiratory: Normal respiratory effort.  No retractions, lungs are clear to auscultation GU: deferred Musculoskeletal: FROM all extremities, warm and well perfused Neurologic:  Normal speech and language.  Skin:  Skin is warm, dry and intact.  Positive for a hives-like rash along the right belt line, area is warm to touch, there is no  drainage, there is no tenderness, neurovascular is intact Psychiatric: Mood and affect are normal. Speech and behavior are normal.  ____________________________________________   LABS (all labs ordered are listed, but only abnormal results are displayed)  Labs Reviewed - No data to display ____________________________________________   ____________________________________________  RADIOLOGY    ____________________________________________   PROCEDURES  Procedure(s) performed: No      ____________________________________________   INITIAL IMPRESSION / ASSESSMENT AND PLAN / ED COURSE  Pertinent labs & imaging results that were available during my care of the patient were reviewed by me and considered  in my medical decision making (see chart for details).  Patient is complaining of a rash to the right lower flank/belt line area, he has been using hydrocortisone cream without relief, his primary care doctor put him on doxycycline for the rash, his wife has become very concerned, diagnosis is urticaria as it appears more hive-like, a prescription for Medrol Dosepak was given, he is to take over-the-counter Claritin, Allegra, or Benadryl, he is to continue his I prescription for doxycycline, if the rash is not improving in the next 3-5 days he should call the surgeon and follow-up with him, or his regular doctor, he was discharged in stable condition and states he understands treatment plan and will comply, he is to return to emergency department if he is worse      ____________________________________________   FINAL CLINICAL IMPRESSION(S) / ED DIAGNOSES  Final diagnoses:  Urticaria      NEW MEDICATIONS STARTED DURING THIS VISIT:  This SmartLink is deprecated. Use AVSMEDLIST instead to display the medication list for a patient.   Note:  This document was prepared using Dragon voice recognition software and may include unintentional dictation errors.    Versie Starks, PA-C 03/30/17 1447    Earleen Newport, MD 03/30/17 [phone]

## 2017-04-25 ENCOUNTER — Encounter: Payer: Self-pay | Admitting: *Deleted

## 2017-05-05 ENCOUNTER — Ambulatory Visit: Payer: Commercial Managed Care - PPO | Admitting: Internal Medicine

## 2017-05-20 ENCOUNTER — Other Ambulatory Visit: Payer: Self-pay | Admitting: Internal Medicine

## 2017-05-20 DIAGNOSIS — K219 Gastro-esophageal reflux disease without esophagitis: Secondary | ICD-10-CM

## 2017-06-14 DIAGNOSIS — H00016 Hordeolum externum left eye, unspecified eyelid: Secondary | ICD-10-CM | POA: Diagnosis not present

## 2017-06-15 ENCOUNTER — Other Ambulatory Visit: Payer: Self-pay | Admitting: Internal Medicine

## 2017-06-15 DIAGNOSIS — K219 Gastro-esophageal reflux disease without esophagitis: Secondary | ICD-10-CM

## 2017-07-05 ENCOUNTER — Ambulatory Visit: Payer: Commercial Managed Care - PPO | Admitting: Internal Medicine

## 2017-07-12 DIAGNOSIS — R040 Epistaxis: Secondary | ICD-10-CM | POA: Diagnosis not present

## 2017-07-12 DIAGNOSIS — R0981 Nasal congestion: Secondary | ICD-10-CM | POA: Diagnosis not present

## 2017-07-18 ENCOUNTER — Other Ambulatory Visit: Payer: Self-pay | Admitting: Internal Medicine

## 2017-07-18 DIAGNOSIS — K219 Gastro-esophageal reflux disease without esophagitis: Secondary | ICD-10-CM

## 2017-07-21 ENCOUNTER — Encounter: Payer: Self-pay | Admitting: Internal Medicine

## 2017-07-21 ENCOUNTER — Ambulatory Visit: Payer: Commercial Managed Care - PPO | Admitting: Internal Medicine

## 2017-07-21 ENCOUNTER — Encounter (INDEPENDENT_AMBULATORY_CARE_PROVIDER_SITE_OTHER): Payer: Self-pay

## 2017-07-21 VITALS — BP 118/80 | HR 84 | Ht 69.0 in | Wt 160.1 lb

## 2017-07-21 DIAGNOSIS — K2270 Barrett's esophagus without dysplasia: Secondary | ICD-10-CM | POA: Diagnosis not present

## 2017-07-21 NOTE — Patient Instructions (Signed)
If you are age 62 or older, your body mass index should be between 23-30. Your Body mass index is 23.65 kg/m. If this is out of the aforementioned range listed, please consider follow up with your Primary Care Provider.  If you are age 78 or younger, your body mass index should be between 19-25. Your Body mass index is 23.65 kg/m. If this is out of the aformentioned range listed, please consider follow up with your Primary Care Provider.   Follow up as needed.   Thank you, Dr. Norman Herrlich

## 2017-07-21 NOTE — Progress Notes (Signed)
Subjective:    Patient ID: Alexander Chambers, male    DOB: November 12, 1955, 62 y.o.   MRN: [id number]  HPI Alexander Chambers is a 62 year old male with history of GERD, Barrett's esophagus without dysplasia, slipped Nissen fundoplication status post laparoscopic repair of hiatal hernia and repair of Nissen fundoplication with Dr. [name] on 03/13/2017, history of IBS predominantly with loose stools, diverticulosis who is here in follow-up.  He was last seen on 02/28/2017.  His surgery went very well with Dr. [name].  Since surgery he has had resolution of his heartburn symptoms.  No issues with dysphagia or odynophagia.  He has weaned off of pantoprazole entirely.  At times he will still feels some early satiety with abdominal bloating but this is mild.  His gastric emptying study was unremarkable but food residue was found in his stomach at the time of the upper endoscopy I performed last year.  His wife has been diagnosed with astrovirus and he is concerned about whether or not he will contract this virus.  We researched it together and found that the incubation period is 2-4 days.  He denies nausea, vomiting and diarrhea.  No fevers.   Review of Systems As per HPI, otherwise negative  Current Medications, Allergies, Past Medical History, Past Surgical History, Family History and Social History were reviewed in [employer] record.     Objective:   Physical Exam BP 118/80   Pulse 84   Ht 5\' 9"  (1.753 m)   Wt 160 lb 2 oz (72.6 kg)   BMI 23.65 kg/m  Constitutional: Well-developed and well-nourished. No distress. HEENT: Normocephalic and atraumatic.   No scleral icterus. Neck: Neck supple. Trachea midline. Cardiovascular: Normal rate, regular rhythm and intact distal pulses.  Pulmonary/chest: Effort normal and breath sounds normal. No wheezing, rales or rhonchi. Abdominal: Soft, nontender, nondistended. Bowel sounds active throughout.  Extremities: no clubbing, cyanosis,  or edema Neurological: Alert and oriented to person place and time. Skin: Skin is warm and dry. Psychiatric: Normal mood and affect. Behavior is normal.      Assessment & Plan:  62 year old male with history of GERD, Barrett's esophagus without dysplasia, slipped Nissen fundoplication status post laparoscopic repair of hiatal hernia and repair of Nissen fundoplication with Dr. [name] on 03/13/2017, history of IBS predominantly with loose stools, diverticulosis who is here in follow-up.  1.  GERD with Barrett's esophagus/recurrent hiatal hernia with slipped Nissen fundoplication status post repair --he is done very well after redo Nissen fundoplication with Dr. [name].  No upper GI symptoms recently.  He is weaned off PPI with no heartburn.  We discussed whether or not he needs chronic PPI due to Barrett's.  Given the absence of symptoms off PPI and the fact that his hiatal hernia has been repaired in addition to the fact that his Barrett's is short segment without dysplasia, I am not convinced that he definitively needs PPI daily.  He is in agreement.  Should he develop heartburn or recurrent GERD symptoms then I do feel daily PPI would be best.  He will let me know if this develops.  Otherwise repeat EGD at the 3-year mark which for him would be June 2021  2.  Possible gastroparesis --he does not have moderate or severe symptoms of gastroparesis though the food residue seen at EGD in the stomach along with early satiety and upper abdominal bloating at times may in fact be due to intermittent gastroparesis.  Gastric emptying study was normal, however.  We  will monitor this going forward  3.  CRC screening --average risk, repeat colonoscopy 2022  4.  Viral enteritis --his wife has astrovirus and I recommended that he wash his hands frequently and after any contact with her.  He will try probiotic for 2-4 weeks, align, to try to provide gut protection.  Follow-up as needed 25 minutes spent with the  patient today. Greater than 50% was spent in counseling and coordination of care with the patient

## 2017-08-08 DIAGNOSIS — R5383 Other fatigue: Secondary | ICD-10-CM | POA: Diagnosis not present

## 2017-08-08 DIAGNOSIS — E1169 Type 2 diabetes mellitus with other specified complication: Secondary | ICD-10-CM | POA: Diagnosis not present

## 2017-08-08 DIAGNOSIS — Z125 Encounter for screening for malignant neoplasm of prostate: Secondary | ICD-10-CM | POA: Diagnosis not present

## 2017-08-08 DIAGNOSIS — Z23 Encounter for immunization: Secondary | ICD-10-CM | POA: Diagnosis not present

## 2017-08-08 DIAGNOSIS — E782 Mixed hyperlipidemia: Secondary | ICD-10-CM | POA: Diagnosis not present

## 2017-08-08 DIAGNOSIS — Z0000 Encounter for general adult medical examination without abnormal findings: Secondary | ICD-10-CM | POA: Diagnosis not present

## 2017-09-29 DIAGNOSIS — E119 Type 2 diabetes mellitus without complications: Secondary | ICD-10-CM | POA: Diagnosis not present

## 2017-10-03 DIAGNOSIS — H00014 Hordeolum externum left upper eyelid: Secondary | ICD-10-CM | POA: Diagnosis not present

## 2017-10-11 DIAGNOSIS — Z23 Encounter for immunization: Secondary | ICD-10-CM | POA: Diagnosis not present

## 2017-11-10 DIAGNOSIS — S0502XA Injury of conjunctiva and corneal abrasion without foreign body, left eye, initial encounter: Secondary | ICD-10-CM | POA: Diagnosis not present

## 2017-11-14 DIAGNOSIS — Z7984 Long term (current) use of oral hypoglycemic drugs: Secondary | ICD-10-CM | POA: Diagnosis not present

## 2017-11-14 DIAGNOSIS — E1169 Type 2 diabetes mellitus with other specified complication: Secondary | ICD-10-CM | POA: Diagnosis not present

## 2017-11-28 DIAGNOSIS — L821 Other seborrheic keratosis: Secondary | ICD-10-CM | POA: Diagnosis not present

## 2017-11-28 DIAGNOSIS — R208 Other disturbances of skin sensation: Secondary | ICD-10-CM | POA: Diagnosis not present

## 2017-11-28 DIAGNOSIS — L820 Inflamed seborrheic keratosis: Secondary | ICD-10-CM | POA: Diagnosis not present

## 2017-11-28 DIAGNOSIS — L299 Pruritus, unspecified: Secondary | ICD-10-CM | POA: Diagnosis not present

## 2018-02-14 DIAGNOSIS — K5792 Diverticulitis of intestine, part unspecified, without perforation or abscess without bleeding: Secondary | ICD-10-CM | POA: Diagnosis not present

## 2018-02-28 DIAGNOSIS — E782 Mixed hyperlipidemia: Secondary | ICD-10-CM | POA: Diagnosis not present

## 2018-02-28 DIAGNOSIS — R197 Diarrhea, unspecified: Secondary | ICD-10-CM | POA: Diagnosis not present

## 2018-02-28 DIAGNOSIS — E1169 Type 2 diabetes mellitus with other specified complication: Secondary | ICD-10-CM | POA: Diagnosis not present

## 2018-03-15 DIAGNOSIS — B349 Viral infection, unspecified: Secondary | ICD-10-CM | POA: Diagnosis not present

## 2018-04-14 IMAGING — CR DG CERVICAL SPINE COMPLETE 4+V
6 series · 6 of 6 positions shown · non-contrast
Comparison: Prior CT scan of the cervical spine 02/07/2013; prior
cervical spine radiographs 01/09/2013

CLINICAL DATA: 59-year-old male with posterior neck pain radiating
bilaterally

EXAM:
CERVICAL SPINE - COMPLETE 4+ VIEW

[c-spine lat]
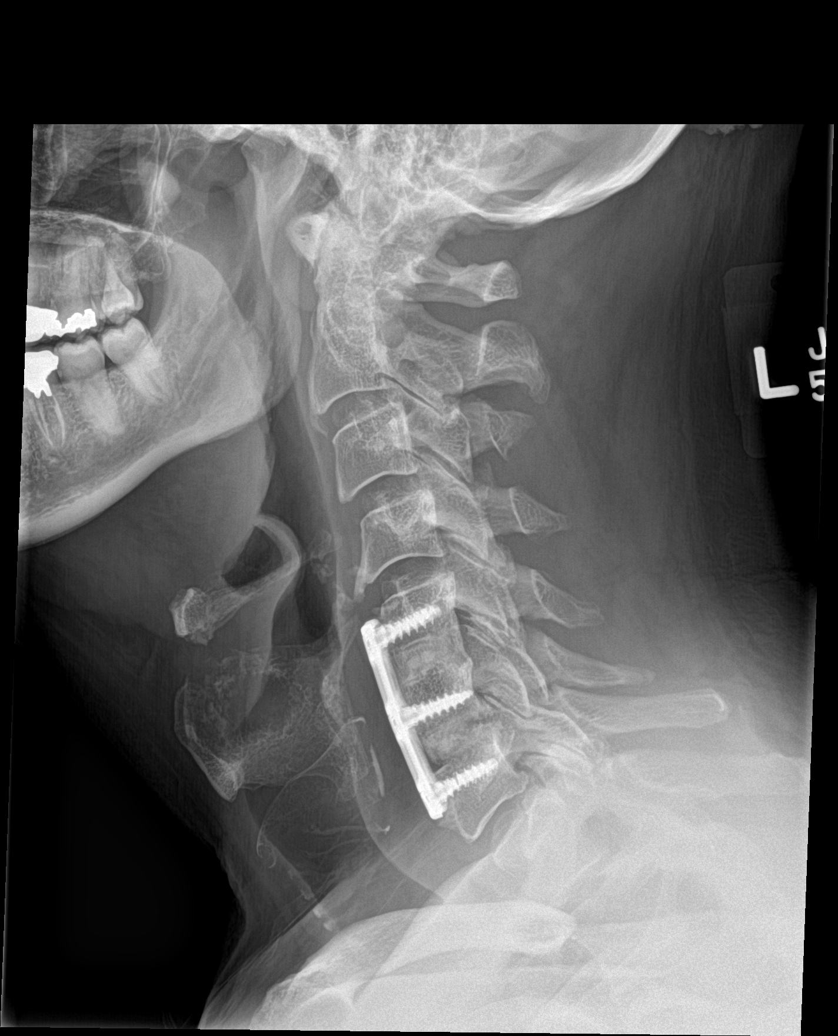

[c-spine obl (1 of 2)]
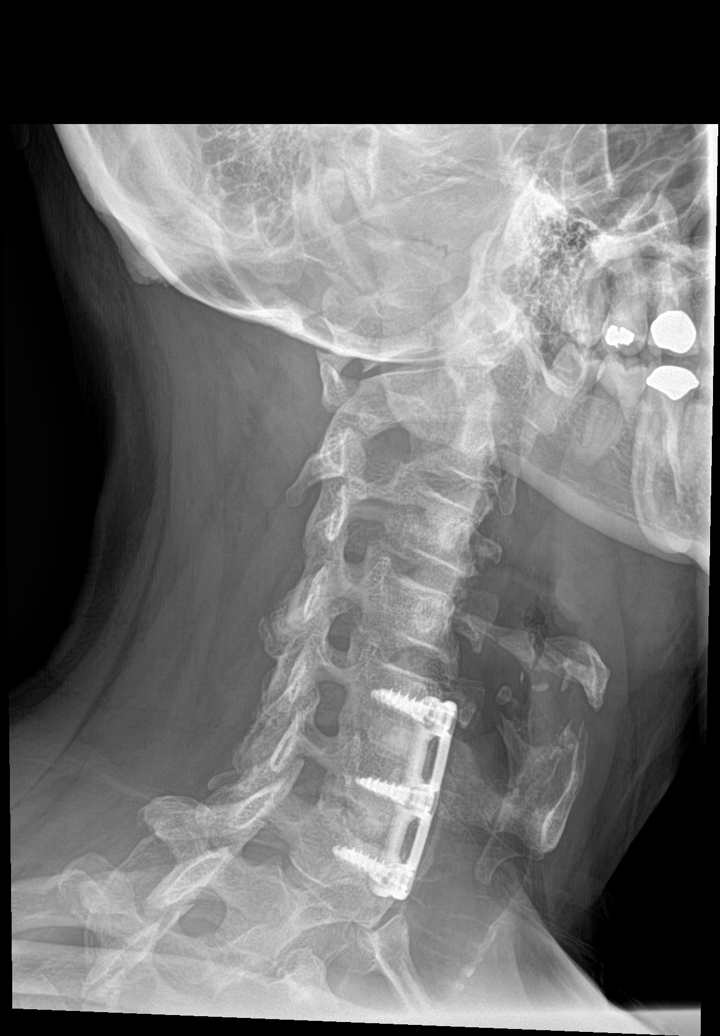

[c-spine obl (2 of 2)]
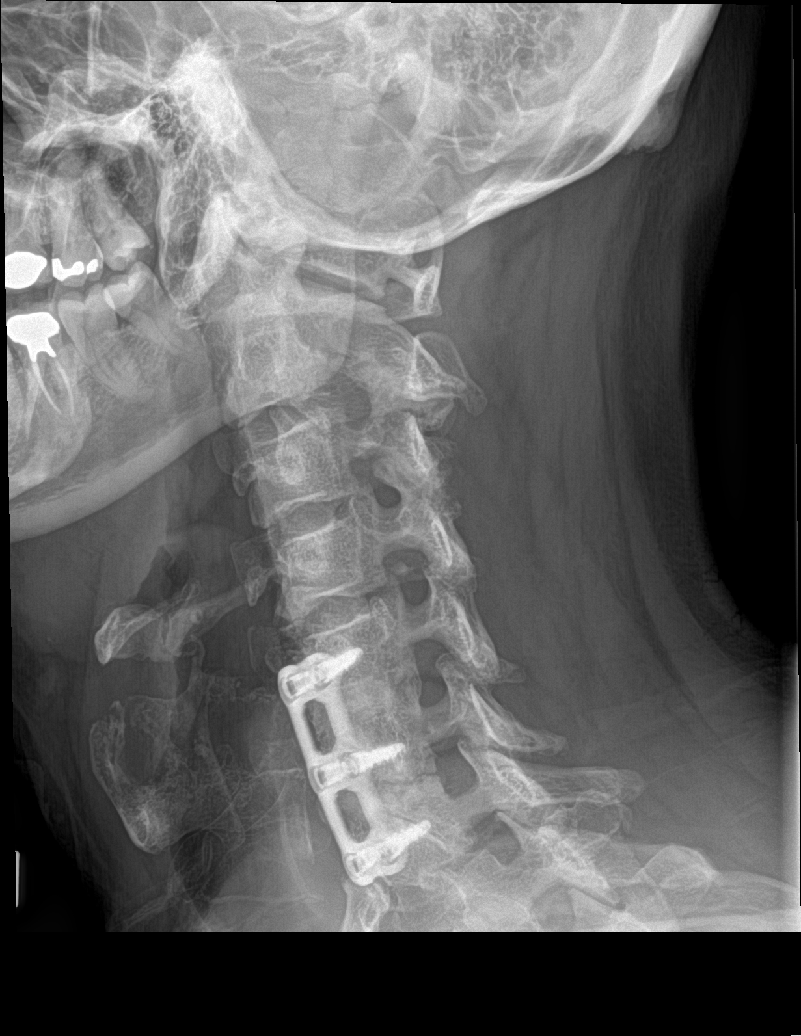

[c-spine ap]
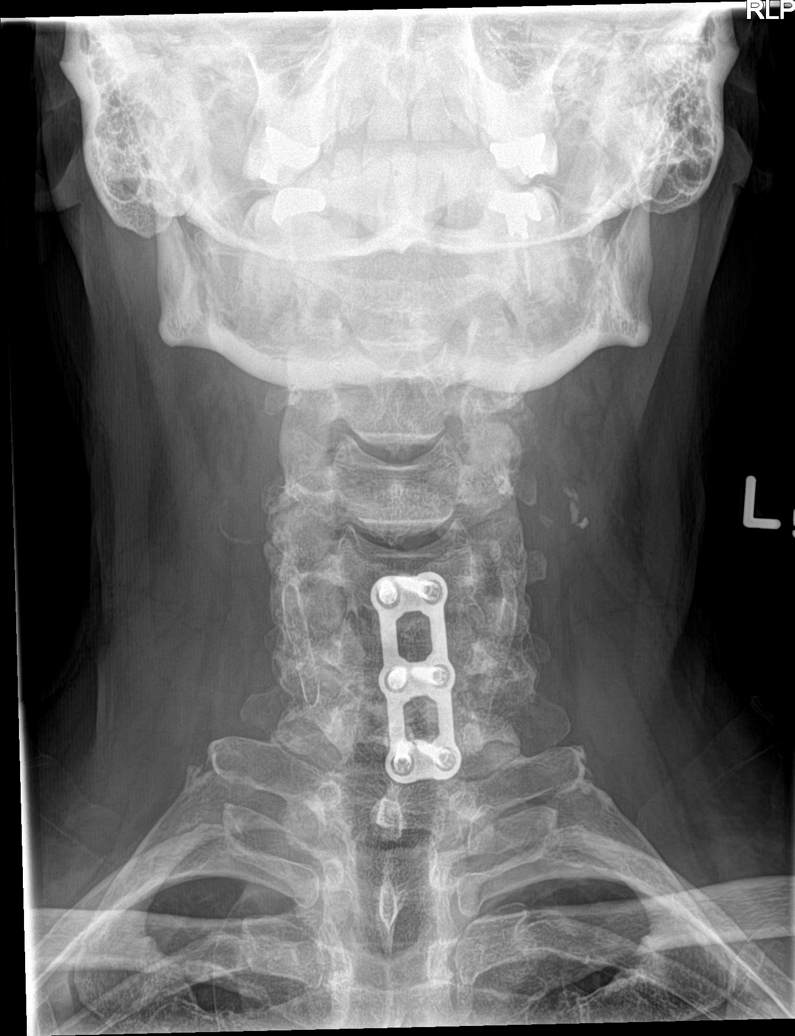

[c-spine open mouth]
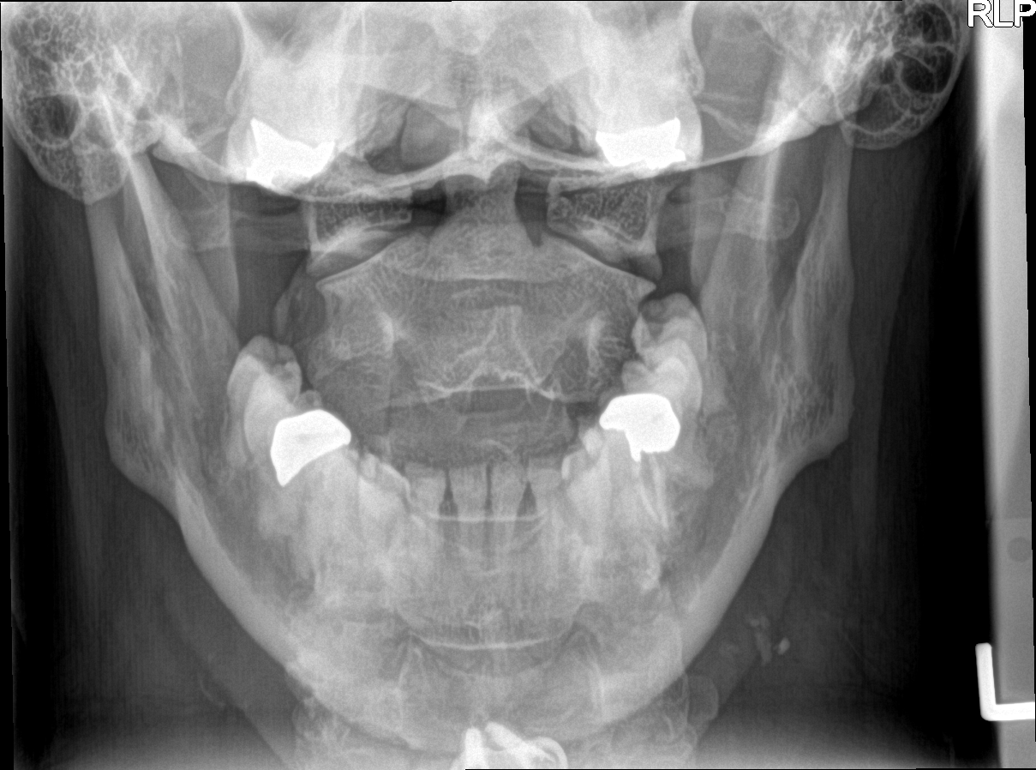

[[person_name]]
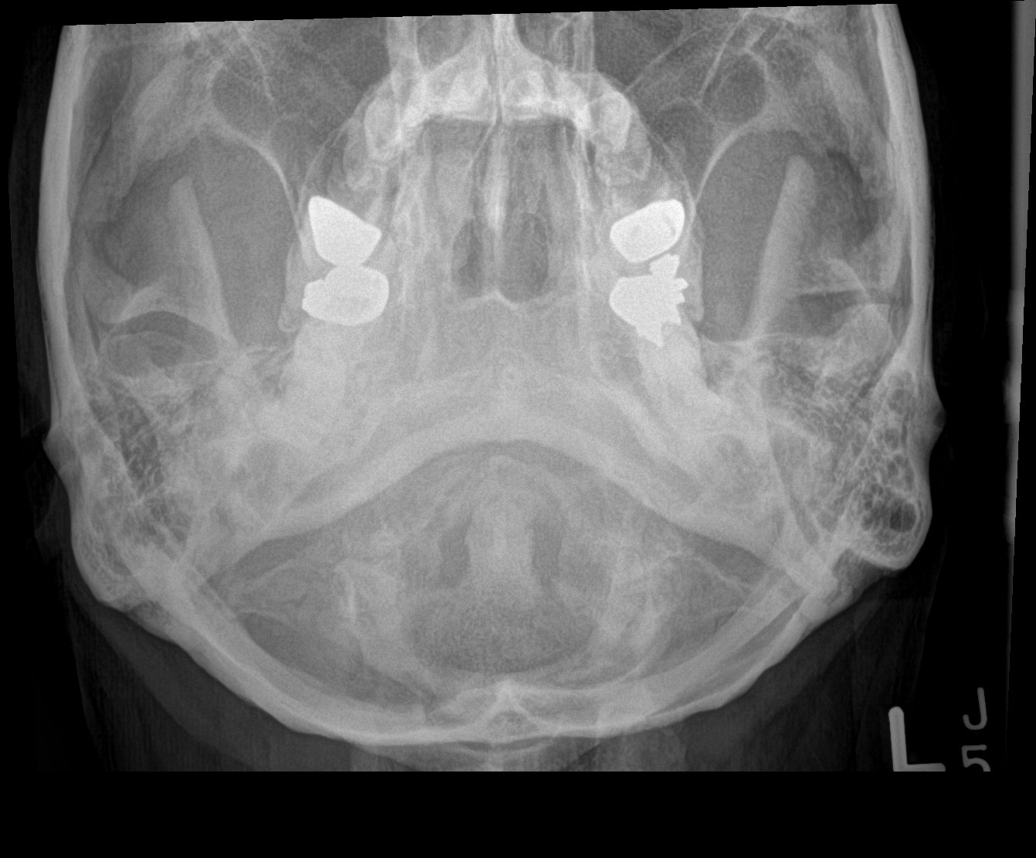

[6 of 6 positions shown; findings below may reference images not displayed]

FINDINGS: Surgical changes of prior anterior cervical discectomy and fusion
with anterior plate and vertebral body screw construct at C5-C7.
Successful bony ankylosis of C5-C6 and C6-C7. No evidence of
hardware complication. Mild left foraminal stenosis at C3-C4.
Interval development of minimal anterolisthesis of C3 on C4
measuring approximately 2 mm. Otherwise, no new degenerative
changes. No prevertebral soft tissue swelling or evidence of
fracture. Atherosclerotic calcifications overlying both carotid
bifurcations.
IMPRESSION: 1. Surgical changes of prior ACDF without evidence of complication.
2. Developing mild anterolisthesis of C3 on C4 measuring
approximately 2 mm.
3. Mild left foraminal stenosis at C3-C4.
4. Atherosclerotic calcifications in the region of the bilateral
carotid bifurcations.

## 2018-05-15 ENCOUNTER — Other Ambulatory Visit: Payer: Self-pay | Admitting: Family Medicine

## 2018-05-15 ENCOUNTER — Ambulatory Visit
Admission: RE | Admit: 2018-05-15 | Discharge: 2018-05-15 | Disposition: A | Discharge status: Home / Self Care | Payer: Commercial Managed Care - PPO | Source: Ambulatory Visit | Attending: Family Medicine | Admitting: Family Medicine

## 2018-05-15 DIAGNOSIS — R079 Chest pain, unspecified: Secondary | ICD-10-CM | POA: Diagnosis not present

## 2018-05-15 DIAGNOSIS — E782 Mixed hyperlipidemia: Secondary | ICD-10-CM | POA: Diagnosis not present

## 2018-05-15 DIAGNOSIS — R5383 Other fatigue: Secondary | ICD-10-CM | POA: Diagnosis not present

## 2018-05-15 DIAGNOSIS — E1169 Type 2 diabetes mellitus with other specified complication: Secondary | ICD-10-CM | POA: Diagnosis not present

## 2018-05-15 DIAGNOSIS — G4733 Obstructive sleep apnea (adult) (pediatric): Secondary | ICD-10-CM | POA: Diagnosis not present

## 2018-05-18 DIAGNOSIS — E291 Testicular hypofunction: Secondary | ICD-10-CM | POA: Diagnosis not present

## 2018-07-13 ENCOUNTER — Telehealth: Payer: Self-pay | Admitting: Interventional Cardiology

## 2018-07-13 DIAGNOSIS — R079 Chest pain, unspecified: Secondary | ICD-10-CM | POA: Insufficient documentation

## 2018-07-13 NOTE — Telephone Encounter (Signed)
Due to COVID 19, the patient desires to change appointment to a later date. After speaking with him, I agree that the New Visit for chest pain can be safely done in 2-3 months unless he develops a change in symptoms.

## 2018-07-14 NOTE — Telephone Encounter (Signed)
Spoke with pt and rescheduled him to see Dr. Tamala Julian 10/18/2018

## 2018-07-17 ENCOUNTER — Ambulatory Visit: Payer: Commercial Managed Care - PPO | Admitting: Interventional Cardiology

## 2018-07-17 DIAGNOSIS — M5416 Radiculopathy, lumbar region: Secondary | ICD-10-CM | POA: Diagnosis not present

## 2018-08-15 DIAGNOSIS — N481 Balanitis: Secondary | ICD-10-CM | POA: Diagnosis not present

## 2018-08-25 DIAGNOSIS — E782 Mixed hyperlipidemia: Secondary | ICD-10-CM | POA: Diagnosis not present

## 2018-08-25 DIAGNOSIS — E291 Testicular hypofunction: Secondary | ICD-10-CM | POA: Diagnosis not present

## 2018-08-25 DIAGNOSIS — E1169 Type 2 diabetes mellitus with other specified complication: Secondary | ICD-10-CM | POA: Diagnosis not present

## 2018-08-31 ENCOUNTER — Other Ambulatory Visit: Payer: Self-pay | Admitting: Orthopedic Surgery

## 2018-08-31 ENCOUNTER — Other Ambulatory Visit (HOSPITAL_COMMUNITY): Payer: Self-pay | Admitting: Orthopedic Surgery

## 2018-08-31 DIAGNOSIS — M5416 Radiculopathy, lumbar region: Secondary | ICD-10-CM

## 2018-09-28 ENCOUNTER — Telehealth: Payer: Self-pay | Admitting: Interventional Cardiology

## 2018-09-28 NOTE — Telephone Encounter (Signed)
New message   Patient states that he was called and states that his appt can be moved up to next week. Please call the patient because the schedule is on hold.

## 2018-10-03 ENCOUNTER — Telehealth: Payer: Self-pay

## 2018-10-03 NOTE — Progress Notes (Addendum)
Cardiology Office Note:    Date:  10/04/2018   ID:  [name], DOB 05-18-1955, MRN [id number]  PCP:  Mayra Neer, MD  Cardiologist:  No primary care provider on file.   Referring MD: Mayra Neer, MD   Chief Complaint  Patient presents with  . Coronary Artery Disease  . Chest Pain  . Diabetes    History of Present Illness:    Alexander Chambers is a 63 y.o. 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  Over the past year he has had intermittent relatively localized left parasternal chest discomfort.  The discomfort can last for hours.  It is present when he is under stress.  It does not seem to be brought on by physical activity.  No associated shortness of breath, diaphoresis, or dizziness.  He has a history of PSVT and underwent successful ablation years ago.  He still has occasional rapid heartbeat that can last up to 5 minutes but at a much slower heart rate than what he had prior to ablation.  He is comfortable with his current state as far as rhythm is concerned.  Past Medical History:  Diagnosis Date  . Allergic rhinitis due to pollen   . Allergy   . Anxiety, generalized   . Arthritis   . Barrett's esophagus   . Cholelithiasis   . Diarrhea   . Diverticulosis   . Diverticulosis   . DM    type 2  . Dysrhythmia    SVT  . ED (erectile dysfunction)   . Fatty liver   . GASTRITIS   . GERD   . Hepatic steatosis   . Hiatal hernia   . History of hiatal hernia   . Insomnia   . Irritable bowel syndrome   . Mixed hyperlipidemia   . Obstructive sleep apnea (adult) (pediatric)   . SVT (supraventricular tachycardia) ([place])   . Tinnitus   . Type 2 diabetes mellitus with other specified complication [facility])     Past Surgical History:   Procedure Laterality Date  . CARPAL TUNNEL RELEASE Right   . ELBOW SURGERY Right   . ESOPHAGEAL MANOMETRY N/A 12/08/2016   Procedure: ESOPHAGEAL MANOMETRY (EM);  Surgeon: Jerene Bears, MD;  Location: [facility] ENDOSCOPY;  Service: Gastroenterology;  Laterality: N/A;  . HERNIA REPAIR     failed surgery  . LAPAROSCOPIC NISSEN FUNDOPLICATION N/A 89/38/1017   Procedure: LAPAROSCOPIC REDO NISSEN FUNDOPLICATION, UPPER ENDOSCOPY;  Surgeon: Johnathan Hausen, MD;  Location: [facility] ORS;  Service: General;  Laterality: N/A;  . SUPRAVENTRICULAR TACHYCARDIA ABLATION N/A 10/13/2011   Procedure: SUPRAVENTRICULAR TACHYCARDIA ABLATION;  Surgeon: Evans Lance, MD;  Location: [facility] CATH LAB;  Service: Cardiovascular;  Laterality: N/A;  . THORACIC DISC SURGERY    . UPPER GASTROINTESTINAL ENDOSCOPY      Current Medications: Current Meds  Medication Sig  . acetaminophen (TYLENOL) 650 MG CR tablet Take 650 mg by mouth every 8 (eight) hours as needed for pain.  Marland Kitchen ALPRAZolam (XANAX) 0.5 MG tablet Take 0.5 mg by mouth at bedtime as needed for anxiety.  Marland Kitchen aspirin EC 81 MG tablet Take 81 mg by mouth daily.  Marland Kitchen buPROPion (WELLBUTRIN XL) 150 MG 24 hr tablet Take 150 mg daily by mouth.  . Empagliflozin-Linagliptin (GLYXAMBI) 25-5  MG TABS Take 1 tablet by mouth daily.  Marland Kitchen glipiZIDE (GLUCOTROL) 10 MG tablet Take 10 mg by mouth 2 (two) times daily before a meal.  . lisinopril (PRINIVIL,ZESTRIL) 5 MG tablet Take 5 mg by mouth daily.  Marland Kitchen loratadine (CLARITIN) 10 MG tablet Take 10 mg by mouth daily.  . metFORMIN (GLUCOPHAGE) 500 MG tablet Take 500-1,000 mg See admin instructions by mouth. Take 1000 mg in the morning and 500 mg in the evening  . Multiple Vitamin (MULTIVITAMIN) capsule Take 1 capsule by mouth daily.  . simvastatin (ZOCOR) 80 MG tablet Take 40 mg by mouth at bedtime.  . sodium chloride (OCEAN) 0.65 % SOLN nasal spray Place 2 sprays into both nostrils as needed for congestion.   Current Facility-Administered Medications for  the 10/04/18 encounter (Office Visit) with Belva Crome, MD  Medication  . 0.9 %  sodium chloride infusion     Allergies:   Effexor [venlafaxine], Metformin and related, and Sudafed [pseudoephedrine hcl]   Social History   Socioeconomic History  . Marital status: Married    Spouse name: Not on file  . Number of children: Not on file  . Years of education: Not on file  . Highest education level: Not on file  Occupational History  . Not on file  Social Needs  . Financial resource strain: Not on file  . Food insecurity    Worry: Not on file    Inability: Not on file  . Transportation needs    Medical: Not on file    Non-medical: Not on file  Tobacco Use  . Smoking status: Never Smoker  . Smokeless tobacco: Never Used  Substance and Sexual Activity  . Alcohol use: Yes    Comment: DAILY- 2 glasses of wine  . Drug use: No  . Sexual activity: Yes  Lifestyle  . Physical activity    Days per week: Not on file    Minutes per session: Not on file  . Stress: Not on file  Relationships  . Social Herbalist on phone: Not on file    Gets together: Not on file    Attends religious service: Not on file    Active member of club or organization: Not on file    Attends meetings of clubs or organizations: Not on file    Relationship status: Not on file  Other Topics Concern  . Not on file  Social History Narrative  . Not on file     Family History: The patient's family history includes Diabetes in his father; Heart failure in his mother; Lung cancer in his father. There is no history of Colon cancer, Esophageal cancer, Stomach cancer, or Rectal cancer.  ROS:   Please see the history of present illness.    He is under stress.  He has not been as physically active since the onset of the COVID 19 pandemic.  All other systems reviewed and are negative.  EKGs/Labs/Other Studies Reviewed:    The following studies were reviewed today: No recent data  EKG:  EKG normal  sinus rhythm with normal appearance.  Recent Labs: No results found for requested labs within last 8760 hours.  Recent Lipid Panel No results found for: CHOL, TRIG, HDL, CHOLHDL, VLDL, LDLCALC, LDLDIRECT    The most recent LDL was 49,  Hemoglobin A1c 7.9 on Aug 25, 2018  Physical Exam:    VS:  BP 128/84   Pulse 74   Ht 5\' 9"  (1.753 m)  Wt 167 lb 12.8 oz (76.1 kg)   SpO2 98%   BMI 24.78 kg/m     Wt Readings from Last 3 Encounters:  10/04/18 167 lb 12.8 oz (76.1 kg)  07/21/17 160 lb 2 oz (72.6 kg)  03/30/17 149 lb (67.6 kg)     GEN: Healthy-appearing. No acute distress HEENT: Normal NECK: No JVD. LYMPHATICS: No lymphadenopathy CARDIAC: RRR.  No murmur, no gallop, no edema VASCULAR: 2+ pulses, no bruits RESPIRATORY:  Clear to auscultation without rales, wheezing or rhonchi  ABDOMEN: Soft, non-tender, non-distended, No pulsatile mass, MUSCULOSKELETAL: No deformity  SKIN: Warm and dry NEUROLOGIC:  Alert and oriented x 3 PSYCHIATRIC:  Normal affect   ASSESSMENT:    1. Other chest pain   2. SVT (supraventricular tachycardia) ([place])   3. Essential hypertension   4. Hyperlipidemia LDL goal <70   5. Educated About Covid-19 Virus Infection    PLAN:    In order of problems listed above:  1. Symptoms are somewhat atypical but there are significant underlying cardiac risk factors including diabetes mellitus type 2, hyperlipidemia, and history of hypertension.  These risk factors are well controlled.  Coronary CT Angio with FFR if needed will be performed to assess coronary anatomy and exclude the possibility that this relatively stable chest pain syndrome is related to significant underlying CAD. 2. He is doing well from this standpoint. 3. 130/80 mmHg his target 4. LDL less than 70.  Most recent LDL was 49 in May 2020  Overall education and awareness concerning primary/secondary risk prevention was discussed in detail: LDL less than 70, hemoglobin A1c less than 7, blood  pressure target less than 130/80 mmHg, >150 minutes of moderate aerobic activity per week, avoidance of smoking, weight control (via diet and exercise), and continued surveillance/management of/for obstructive sleep apnea.    Medication Adjustments/Labs and Tests Ordered: Current medicines are reviewed at length with the patient today.  Concerns regarding medicines are outlined above.  Orders Placed This Encounter  Procedures  . CT CORONARY MORPH W/CTA COR W/SCORE W/CA W/CM &/OR WO/CM  . CT CORONARY FRACTIONAL FLOW RESERVE DATA PREP  . CT CORONARY FRACTIONAL FLOW RESERVE FLUID ANALYSIS  . Basic metabolic panel  . EKG 12-Lead   No orders of the defined types were placed in this encounter.   There are no Patient Instructions on file for this visit.   Signed, Sinclair Grooms, MD  10/04/2018 10:11 AM    [place]

## 2018-10-03 NOTE — Telephone Encounter (Signed)

## 2018-10-04 ENCOUNTER — Other Ambulatory Visit: Payer: Self-pay

## 2018-10-04 ENCOUNTER — Ambulatory Visit: Payer: Commercial Managed Care - PPO | Admitting: Interventional Cardiology

## 2018-10-04 ENCOUNTER — Encounter: Payer: Self-pay | Admitting: Interventional Cardiology

## 2018-10-04 VITALS — BP 128/84 | HR 74 | Ht 69.0 in | Wt 167.8 lb

## 2018-10-04 DIAGNOSIS — I471 Supraventricular tachycardia: Secondary | ICD-10-CM | POA: Diagnosis not present

## 2018-10-04 DIAGNOSIS — I10 Essential (primary) hypertension: Secondary | ICD-10-CM | POA: Diagnosis not present

## 2018-10-04 DIAGNOSIS — E785 Hyperlipidemia, unspecified: Secondary | ICD-10-CM | POA: Diagnosis not present

## 2018-10-04 DIAGNOSIS — Z7189 Other specified counseling: Secondary | ICD-10-CM

## 2018-10-04 DIAGNOSIS — R0789 Other chest pain: Secondary | ICD-10-CM | POA: Diagnosis not present

## 2018-10-04 MED ORDER — METOPROLOL TARTRATE 100 MG PO TABS: ORAL_TABLET | ORAL | 0 refills | Status: DC

## 2018-10-04 NOTE — Patient Instructions (Addendum)
Medication Instructions:  Your physician recommends that you continue on your current medications as directed. Please refer to the Current Medication list given to you today.  If you need a refill on your cardiac medications before your next appointment, please call your pharmacy.   Lab work: None If you have labs (blood work) drawn today and your tests are completely normal, you will receive your results only by: Marland Kitchen MyChart Message (if you have MyChart) OR . A paper copy in the mail If you have any lab test that is abnormal or we need to change your treatment, we will call you to review the results.  Testing/Procedures: Your physician recommends that you have a Coronary CT performed.   Follow-Up: Your physician recommends that you schedule a follow-up appointment as needed with Dr. Tamala Julian.    Any Other Special Instructions Will Be Listed Below (If Applicable).   Please arrive at the [facility] main entrance of [facility] 30-45 minutes prior to test start time  [facility] [place], [place] [address] [phone]  Proceed to the [facility] Radiology Department (First Floor).  Please follow these instructions carefully (unless otherwise directed):  Hold all erectile dysfunction medications at least 48 hours prior to test.  On the Night Before the Test: . Be sure to Drink plenty of water. . Do not consume any caffeinated/decaffeinated beverages or chocolate 12 hours prior to your test. . Do not take any antihistamines 12 hours prior to your test. . If you take Metformin do not take 24 hours prior to test.   On the Day of the Test: . Drink plenty of water. Do not drink any water within one hour of the test. . Do not eat any food 4 hours prior to the test. . You may take your regular medications prior to the test.  . Take metoprolol (Lopressor) two hours prior to test. . HOLD Furosemide/Hydrochlorothiazide morning of the test.        After the Test: . Drink plenty of water. . After receiving IV contrast, you may experience a mild flushed feeling. This is normal. . On occasion, you may experience a mild rash up to 24 hours after the test. This is not dangerous. If this occurs, you can take Benadryl 25 mg and increase your fluid intake. . If you experience trouble breathing, this can be serious. If it is severe call 911 IMMEDIATELY. If it is mild, please call our office. . If you take any of these medications: Glipizide/Metformin, Avandament, Glucavance, please do not take 48 hours after completing test.

## 2018-10-18 ENCOUNTER — Ambulatory Visit: Payer: Commercial Managed Care - PPO | Admitting: Interventional Cardiology

## 2018-10-20 ENCOUNTER — Ambulatory Visit: Admission: RE | Admit: 2018-10-20 | Payer: Commercial Managed Care - PPO | Source: Ambulatory Visit

## 2018-10-23 ENCOUNTER — Other Ambulatory Visit: Payer: Self-pay

## 2018-10-23 ENCOUNTER — Other Ambulatory Visit: Payer: Commercial Managed Care - PPO | Admitting: *Deleted

## 2018-10-23 DIAGNOSIS — R0789 Other chest pain: Secondary | ICD-10-CM

## 2018-10-23 DIAGNOSIS — I10 Essential (primary) hypertension: Secondary | ICD-10-CM

## 2018-10-24 LAB — BASIC METABOLIC PANEL
BUN/Creatinine Ratio: 18 (ref 10–24)
BUN: 21 mg/dL (ref 8–27)
CO2: 24 mmol/L (ref 20–29)
Calcium: 9.1 mg/dL (ref 8.6–10.2)
Chloride: 100 mmol/L (ref 96–106)
Creatinine, Ser: 1.2 mg/dL (ref 0.76–1.27)
GFR calc Af Amer: 74 mL/min/{1.73_m2} (ref >59)
GFR calc non Af Amer: 64 mL/min/{1.73_m2} (ref >59)
Glucose: 196 mg/dL — ABNORMAL HIGH (ref 65–99)
Potassium: 4.7 mmol/L (ref 3.5–5.2)
Sodium: 141 mmol/L (ref 134–144)

## 2018-10-25 ENCOUNTER — Telehealth: Payer: Self-pay

## 2018-10-25 NOTE — Telephone Encounter (Signed)
-----   Message from Nuala Alpha, LPN sent at 12/05/4156 12:17 PM EDT -----  ----- Message ----- From: Belva Crome, MD Sent: 10/24/2018   1:30 PM EDT To: Mayra Neer, MD, Loren Racer, LPN  Let the patient know labs look okay to proceed with CTA. A copy will be sent to Mayra Neer, MD

## 2018-10-25 NOTE — Telephone Encounter (Signed)
Left detailed message (DPR on File) for patient about lab results.

## 2018-10-26 ENCOUNTER — Telehealth (HOSPITAL_COMMUNITY): Payer: Self-pay | Admitting: Emergency Medicine

## 2018-10-26 ENCOUNTER — Telehealth: Payer: Self-pay | Admitting: Interventional Cardiology

## 2018-10-26 NOTE — Telephone Encounter (Signed)
I spoke to the patient and informed him of the equipment used for the CT.   I instructed him to take his prescribed Xanax, if felt needed.  He verbalized understanding and was thankful for the call.

## 2018-10-26 NOTE — Telephone Encounter (Signed)
  Patient is having CT done on 10/27/18 @ 12:30 and he is calling because he is claustrophobic and may need something to calm him during the CT. Please advise.

## 2018-10-26 NOTE — Telephone Encounter (Signed)
Reaching out to patient to offer assistance regarding upcoming cardiac imaging study; pt verbalizes understanding of appt date/time, parking situation and where to check in, pre-test NPO status and medications ordered, and verified current allergies; name and call back number provided for further questions should they arise Chibueze Beasley RN Navigator Cardiac Imaging [facility] [phone] office [phone] cell  Pt denies covid symptoms, verbalized understanding of visitor policy. 

## 2018-10-27 ENCOUNTER — Ambulatory Visit (HOSPITAL_COMMUNITY): Payer: Commercial Managed Care - PPO

## 2018-10-27 ENCOUNTER — Other Ambulatory Visit: Payer: Self-pay

## 2018-10-27 ENCOUNTER — Ambulatory Visit (HOSPITAL_COMMUNITY)
Admission: RE | Admit: 2018-10-27 | Discharge: 2018-10-27 | Disposition: A | Discharge status: Home / Self Care | Payer: Commercial Managed Care - PPO | Source: Ambulatory Visit | Attending: Interventional Cardiology | Admitting: Interventional Cardiology

## 2018-10-27 DIAGNOSIS — R0789 Other chest pain: Secondary | ICD-10-CM | POA: Insufficient documentation

## 2018-10-27 MED ORDER — IOHEXOL 350 MG/ML SOLN
80.0000 mL | Freq: Once | INTRAVENOUS | Status: AC | PRN
  Administered 2018-10-27: 13:00:00 80 mL via INTRAVENOUS

## 2018-10-27 MED ORDER — NITROGLYCERIN 0.4 MG SL SUBL
0.8000 mg | SUBLINGUAL_TABLET | Freq: Once | SUBLINGUAL | Status: AC
  Administered 2018-10-27: 12:00:00 0.8 mg via SUBLINGUAL
  Filled 2018-10-27: qty 25

## 2018-10-27 MED ORDER — NITROGLYCERIN 0.4 MG SL SUBL
SUBLINGUAL_TABLET | SUBLINGUAL | Status: AC
  Filled 2018-10-27: qty 2

## 2019-06-22 ENCOUNTER — Other Ambulatory Visit: Payer: Self-pay | Admitting: Orthopedic Surgery

## 2019-06-22 DIAGNOSIS — M25512 Pain in left shoulder: Secondary | ICD-10-CM

## 2019-07-03 ENCOUNTER — Other Ambulatory Visit: Payer: Self-pay | Admitting: Orthopedic Surgery

## 2019-07-05 ENCOUNTER — Other Ambulatory Visit: Payer: Self-pay | Admitting: Orthopedic Surgery

## 2019-07-06 ENCOUNTER — Other Ambulatory Visit: Payer: Self-pay | Admitting: Orthopedic Surgery

## 2019-07-06 DIAGNOSIS — M25512 Pain in left shoulder: Secondary | ICD-10-CM

## 2019-07-09 ENCOUNTER — Ambulatory Visit
Admission: RE | Admit: 2019-07-09 | Discharge: 2019-07-09 | Disposition: A | Discharge status: Home / Self Care | Payer: Commercial Managed Care - PPO | Source: Ambulatory Visit | Attending: Orthopedic Surgery | Admitting: Orthopedic Surgery

## 2019-07-09 ENCOUNTER — Other Ambulatory Visit: Payer: Self-pay

## 2019-07-09 ENCOUNTER — Other Ambulatory Visit: Payer: Commercial Managed Care - PPO

## 2019-07-09 DIAGNOSIS — M25512 Pain in left shoulder: Secondary | ICD-10-CM

## 2019-07-11 ENCOUNTER — Other Ambulatory Visit: Payer: Self-pay | Admitting: Orthopedic Surgery

## 2019-07-11 DIAGNOSIS — D169 Benign neoplasm of bone and articular cartilage, unspecified: Secondary | ICD-10-CM

## 2019-07-23 ENCOUNTER — Other Ambulatory Visit: Payer: Self-pay | Admitting: Orthopedic Surgery

## 2019-07-23 ENCOUNTER — Other Ambulatory Visit (HOSPITAL_COMMUNITY): Payer: Self-pay | Admitting: Orthopedic Surgery

## 2019-07-23 DIAGNOSIS — D1602 Benign neoplasm of scapula and long bones of left upper limb: Secondary | ICD-10-CM

## 2019-07-25 ENCOUNTER — Other Ambulatory Visit: Payer: Commercial Managed Care - PPO

## 2019-07-26 ENCOUNTER — Other Ambulatory Visit: Payer: Self-pay

## 2019-07-26 ENCOUNTER — Encounter (HOSPITAL_COMMUNITY)
Admission: RE | Admit: 2019-07-26 | Discharge: 2019-07-26 | Disposition: A | Discharge status: Home / Self Care | Payer: Commercial Managed Care - PPO | Source: Ambulatory Visit | Attending: Orthopedic Surgery | Admitting: Orthopedic Surgery

## 2019-07-26 ENCOUNTER — Other Ambulatory Visit (HOSPITAL_COMMUNITY): Payer: Commercial Managed Care - PPO

## 2019-07-26 ENCOUNTER — Ambulatory Visit (HOSPITAL_COMMUNITY): Payer: Commercial Managed Care - PPO

## 2019-07-26 DIAGNOSIS — D1602 Benign neoplasm of scapula and long bones of left upper limb: Secondary | ICD-10-CM | POA: Insufficient documentation

## 2019-07-26 MED ORDER — TECHNETIUM TC 99M MEDRONATE IV KIT
20.0000 | PACK | Freq: Once | INTRAVENOUS | Status: AC | PRN
  Administered 2019-07-26: 14:00:00 20 via INTRAVENOUS

## 2019-07-31 ENCOUNTER — Other Ambulatory Visit (HOSPITAL_COMMUNITY): Payer: Commercial Managed Care - PPO

## 2019-07-31 ENCOUNTER — Ambulatory Visit (HOSPITAL_COMMUNITY): Payer: Commercial Managed Care - PPO

## 2019-08-06 ENCOUNTER — Ambulatory Visit: Payer: Commercial Managed Care - PPO | Admitting: Nurse Practitioner

## 2019-10-01 ENCOUNTER — Encounter: Payer: Self-pay | Admitting: Internal Medicine

## 2019-10-01 ENCOUNTER — Other Ambulatory Visit: Payer: Self-pay

## 2019-10-01 ENCOUNTER — Ambulatory Visit (AMBULATORY_SURGERY_CENTER): Payer: Self-pay | Admitting: *Deleted

## 2019-10-01 VITALS — Ht 69.0 in | Wt 167.0 lb

## 2019-10-01 DIAGNOSIS — K2270 Barrett's esophagus without dysplasia: Secondary | ICD-10-CM

## 2019-10-01 NOTE — Progress Notes (Signed)

## 2019-10-08 ENCOUNTER — Other Ambulatory Visit: Payer: Self-pay

## 2019-10-08 ENCOUNTER — Ambulatory Visit (AMBULATORY_SURGERY_CENTER): Payer: Commercial Managed Care - PPO | Admitting: Internal Medicine

## 2019-10-08 ENCOUNTER — Encounter: Payer: Self-pay | Admitting: Internal Medicine

## 2019-10-08 VITALS — BP 114/70 | HR 69 | Temp 97.3°F | Resp 16 | Ht 69.0 in | Wt 167.0 lb

## 2019-10-08 DIAGNOSIS — K2950 Unspecified chronic gastritis without bleeding: Secondary | ICD-10-CM

## 2019-10-08 DIAGNOSIS — K2270 Barrett's esophagus without dysplasia: Secondary | ICD-10-CM | POA: Diagnosis not present

## 2019-10-08 MED ORDER — SODIUM CHLORIDE 0.9 % IV SOLN: 500.0000 mL | Freq: Once | INTRAVENOUS | Status: DC

## 2019-10-08 NOTE — Patient Instructions (Signed)
Resume previous diet continue present medications Await pathology results  YOU HAD AN ENDOSCOPIC PROCEDURE TODAY AT [place]:   Refer to the procedure report that was given to you for any specific questions about what was found during the examination.  If the procedure report does not answer your questions, please call your gastroenterologist to clarify.  If you requested that your care partner not be given the details of your procedure findings, then the procedure report has been included in a sealed envelope for you to review at your convenience later.  YOU SHOULD EXPECT: Some feelings of bloating in the abdomen. Passage of more gas than usual.  Walking can help get rid of the air that was put into your GI tract during the procedure and reduce the bloating. If you had a lower endoscopy (such as a colonoscopy or flexible sigmoidoscopy) you may notice spotting of blood in your stool or on the toilet paper. If you underwent a bowel prep for your procedure, you may not have a normal bowel movement for a few days.  Please Note:  You might notice some irritation and congestion in your nose or some drainage.  This is from the oxygen used during your procedure.  There is no need for concern and it should clear up in a day or so.  SYMPTOMS TO REPORT IMMEDIATELY:   Following upper endoscopy (EGD)  Vomiting of blood or coffee ground material  New chest pain or pain under the shoulder blades  Painful or persistently difficult swallowing  New shortness of breath  Fever of 100F or higher  Black, tarry-looking stools  For urgent or emergent issues, a gastroenterologist can be reached at any hour by calling [phone]. Do not use MyChart messaging for urgent concerns.    DIET:  We do recommend a small meal at first, but then you may proceed to your regular diet.  Drink plenty of fluids but you should avoid alcoholic beverages for 24 hours.  ACTIVITY:  You should plan to take it  easy for the rest of today and you should NOT DRIVE or use heavy machinery until tomorrow (because of the sedation medicines used during the test).    FOLLOW UP: Our staff will call the number listed on your records 48-72 hours following your procedure to check on you and address any questions or concerns that you may have regarding the information given to you following your procedure. If we do not reach you, we will leave a message.  We will attempt to reach you two times.  During this call, we will ask if you have developed any symptoms of COVID 19. If you develop any symptoms (ie: fever, flu-like symptoms, shortness of breath, cough etc.) before then, please call [phone].  If you test positive for Covid 19 in the 2 weeks post procedure, please call and report this information to [place].    If any biopsies were taken you will be contacted by phone or by letter within the next 1-3 weeks.  Please call us at [phone] if you have not heard about the biopsies in 3 weeks.    SIGNATURES/CONFIDENTIALITY: You and/or your care partner have signed paperwork which will be entered into your electronic medical record.  These signatures attest to the fact that that the information above on your After Visit Summary has been reviewed and is understood.  Full responsibility of the confidentiality of this discharge information lies with you and/or your care-partner.

## 2019-10-08 NOTE — Op Note (Signed)
[name] Patient Name: Alexander Chambers Procedure Date: 10/08/2019 10:05 AM MRN: [id number] Endoscopist: Jerene Bears , MD Age: 64 Referring MD:  Date of Birth: [birth year] Gender: Male Account #: [id number] Procedure:                Upper GI endoscopy Indications:              Follow-up of Barrett's esophagus, last EGD June 2018 Medicines:                Monitored Anesthesia Care Procedure:                Pre-Anesthesia Assessment:                           - Prior to the procedure, a History and Physical                            was performed, and patient medications and                            allergies were reviewed. The patient's tolerance of                            previous anesthesia was also reviewed. The risks                            and benefits of the procedure and the sedation                            options and risks were discussed with the patient.                            All questions were answered, and informed consent                            was obtained. Prior Anticoagulants: The patient has                            taken no previous anticoagulant or antiplatelet                            agents. ASA Grade Assessment: III - A patient with                            severe systemic disease. After reviewing the risks                            and benefits, the patient was deemed in                            satisfactory condition to undergo the procedure.                           After obtaining informed consent, the endoscope was  passed under direct vision. Throughout the                            procedure, the patient's blood pressure, pulse, and                            oxygen saturations were monitored continuously. The                            Endoscope was introduced through the mouth, and                            advanced to the duodenal bulb. The upper GI                            endoscopy  was accomplished without difficulty. The                            patient tolerated the procedure well. Scope In: Scope Out: Findings:                 The esophagus and gastroesophageal junction were                            examined with white light and narrow band imaging                            (NBI) from a forward view and retroflexed position.                            There were esophageal mucosal changes consistent                            with short-segment Barrett's esophagus. These                            changes involved the mucosa at the upper extent of                            the gastric folds (37 cm from the incisors)                            extending to the Z-line (34 cm from the incisors).                            Circumferential salmon-colored mucosa was present                            from 36 to 37 cm, two tongues of salmon-colored                            mucosa were present from 35 to 36 cm and scattered  islands of salmon-colored mucosa were present from                            34 to 35 cm. The maximum longitudinal extent of                            these esophageal mucosal changes was 3 cm in                            length. Mucosa was biopsied with a cold forceps for                            histology in a targeted manner and in 4 quadrants                            at intervals of 1 cm from 34 to 37 cm from the                            incisors. A total of 3 specimen bottles were sent                            to pathology.                           Evidence of a Nissen fundoplication was found in                            the cardia. The wrap appeared intact.                           The entire examined stomach was normal.                           The examined duodenum was normal. Complications:            No immediate complications. Estimated Blood Loss:     Estimated blood loss was  minimal. Impression:               - Esophageal mucosal changes consistent with                            short-segment Barrett's esophagus. Biopsied.                           - A prior Nissen fundoplication was found.                           - Normal stomach.                           - Normal examined duodenum. Recommendation:           - Patient has a contact number available for  emergencies. The signs and symptoms of potential                            delayed complications were discussed with the                            patient. Return to normal activities tomorrow.                            Written discharge instructions were provided to the                            patient.                           - Resume previous diet.                           - Continue present medications.                           - Await pathology results.                           - Repeat upper endoscopy for surveillance based on                            pathology results. Jerene Bears, MD 10/08/2019 10:34:14 AM This report has been signed electronically.

## 2019-10-08 NOTE — Progress Notes (Signed)
Pt's states no medical or surgical changes since previsit or office visit.  VS by VV

## 2019-10-08 NOTE — Progress Notes (Signed)
Called to room to assist during endoscopic procedure.  Patient ID and intended procedure confirmed with present staff. Received instructions for my participation in the procedure from the performing physician.  

## 2019-10-08 NOTE — Progress Notes (Signed)
pt tolerated well. VSS. awake and to recovery. Report given to RN.  

## 2019-10-10 ENCOUNTER — Telehealth: Payer: Self-pay

## 2019-10-10 ENCOUNTER — Telehealth: Payer: Self-pay | Admitting: *Deleted

## 2019-10-10 ENCOUNTER — Encounter: Payer: Self-pay | Admitting: Internal Medicine

## 2019-10-10 NOTE — Telephone Encounter (Signed)
°  Follow up Call-  Call back number 10/08/2019  Post procedure Call Back phone  # [phone]  Permission to leave phone message Yes  Some recent data might be hidden     Patient questions:  Do you have a fever, pain , or abdominal swelling? No. Pain Score  0 *  Have you tolerated food without any problems? Yes.    Have you been able to return to your normal activities? Yes.    Do you have any questions about your discharge instructions: Diet   No. Medications  No. Follow up visit  No.  Do you have questions or concerns about your Care? No.  Actions: * If pain score is 4 or above: No action needed, pain <4.   1. Have you developed a fever since your procedure? no  2.   Have you had an respiratory symptoms (SOB or cough) since your procedure? no  3.   Have you tested positive for COVID 19 since your procedure no  4.   Have you had any family members/close contacts diagnosed with the COVID 19 since your procedure?  no   If yes to any of these questions please route to Joylene John, RN and Erenest Rasher, RN

## 2019-10-10 NOTE — Telephone Encounter (Signed)
Called [phone] and left a messaged we tried to reach pt for a follow up call. maw

## 2020-12-31 ENCOUNTER — Ambulatory Visit: Payer: Commercial Managed Care - PPO | Admitting: Physician Assistant

## 2021-01-15 ENCOUNTER — Ambulatory Visit: Payer: Commercial Managed Care - PPO | Admitting: Physician Assistant

## 2021-01-15 ENCOUNTER — Encounter: Payer: Self-pay | Admitting: Physician Assistant

## 2021-01-15 VITALS — BP 122/72 | HR 85 | Ht 69.0 in | Wt 162.5 lb

## 2021-01-15 DIAGNOSIS — Z1211 Encounter for screening for malignant neoplasm of colon: Secondary | ICD-10-CM | POA: Diagnosis not present

## 2021-01-15 DIAGNOSIS — Z8719 Personal history of other diseases of the digestive system: Secondary | ICD-10-CM

## 2021-01-15 DIAGNOSIS — R1310 Dysphagia, unspecified: Secondary | ICD-10-CM

## 2021-01-15 MED ORDER — OMEPRAZOLE 20 MG PO CPDR: 20.0000 mg | DELAYED_RELEASE_CAPSULE | Freq: Every day | ORAL | 11 refills | Status: DC

## 2021-01-15 MED ORDER — NA SULFATE-K SULFATE-MG SULF 17.5-3.13-1.6 GM/177ML PO SOLN: 1.0000 | Freq: Once | ORAL | 0 refills | Status: AC

## 2021-01-15 NOTE — Patient Instructions (Addendum)
We have sent the following medications to your pharmacy for you to pick up at your convenience: Omeprazole 20 mg 30-60 minutes before breakfast.   You have been scheduled for an endoscopy and colonoscopy. Please follow the written instructions given to you at your visit today. Please pick up your prep supplies at the pharmacy within the next 1-3 days. If you use inhalers (even only as needed), please bring them with you on the day of your procedure.  If you are age 1 or older, your body mass index should be between 23-30. Your Body mass index is 24 kg/m. If this is out of the aforementioned range listed, please consider follow up with your Primary Care Provider.  If you are age 65 or younger, your body mass index should be between 19-25. Your Body mass index is 24 kg/m. If this is out of the aformentioned range listed, please consider follow up with your Primary Care Provider.   __________________________________________________________  The [facility] providers would like to encourage you to use [facility] to communicate with providers for non-urgent requests or questions.  Due to long hold times on the telephone, sending your provider a message by [facility] may be a faster and more efficient way to get a response.  Please allow 48 business hours for a response.  Please remember that this is for non-urgent requests.

## 2021-01-15 NOTE — Progress Notes (Signed)
Chief Complaint: Dysphagia  HPI:    [name] is a 65 year old Caucasian male with a past medical history as listed below including Barrett's esophagus known to Dr. Hilarie Chambers, who was referred to me by Alexander Neer, MD for a complaint of dysphagia.    04/27/2010 colonoscopy with Dr. Sharlett Chambers with mild diverticulosis in the sigmoid to descending colon, no polyps or cancers, repeat recommended in 5 years at that point.    10/08/2019 EGD with esophageal mucosal changes consistent with short segment Barrett's, prior Nissen fundoplication was found.  Pathology continue to show Barrett's esophagus and repeat was recommended in 3 years    Today, the patient presents to clinic and tells me that over the past several months he has had some trouble with food getting hung up in his throat.  It seems to be hung up but just the top of his esophagus and he can easily "get it back out".  Initially this started with just pills and now has moved towards nuts or other small pieces of things as well as peanut butter and salads.  Feels like this happens towards the top of his throat.  Also describes occasional reflux.    Denies fever, chills, weight loss, blood in his stool, change in bowel habits or abdominal pain.  Past Medical History:  Diagnosis Date   Allergic rhinitis due to pollen    Allergy    Anxiety, generalized    Arthritis    Barrett's esophagus    Cholelithiasis    Diarrhea    Diverticulosis    Diverticulosis    DM    type 2   Dysrhythmia    SVT   ED (erectile dysfunction)    Fatty liver    GASTRITIS    GERD    Hepatic steatosis    Hiatal hernia    History of hiatal hernia    Insomnia    Irritable bowel syndrome    Mixed hyperlipidemia    Obstructive sleep apnea (adult) (pediatric)    SVT (supraventricular tachycardia) (HCC)    Tinnitus    Type 2 diabetes mellitus with other specified complication [facility])     Past Surgical History:  Procedure Laterality Date   CARPAL TUNNEL  RELEASE Right    ELBOW SURGERY Right    ESOPHAGEAL MANOMETRY N/A 12/08/2016   Procedure: ESOPHAGEAL MANOMETRY (EM);  Surgeon: Alexander Bears, MD;  Location: [facility];  Service: Gastroenterology;  Laterality: N/A;   HERNIA REPAIR     failed surgery   LAPAROSCOPIC NISSEN FUNDOPLICATION N/A 65/46/5035   Procedure: LAPAROSCOPIC REDO NISSEN FUNDOPLICATION, UPPER ENDOSCOPY;  Surgeon: Alexander Hausen, MD;  Location: [facility] ORS;  Service: General;  Laterality: N/A;   SUPRAVENTRICULAR TACHYCARDIA ABLATION N/A 10/13/2011   Procedure: SUPRAVENTRICULAR TACHYCARDIA ABLATION;  Surgeon: Alexander Lance, MD;  Location: [facility] CATH LAB;  Service: Cardiovascular;  Laterality: N/A;   THORACIC DISC SURGERY     UPPER GASTROINTESTINAL ENDOSCOPY      Current Outpatient Medications  Medication Sig Dispense Refill   acetaminophen (TYLENOL) 650 MG CR tablet Take 650 mg by mouth every 8 (eight) hours as needed for pain.     aspirin EC 81 MG tablet Take 81 mg by mouth daily.     buPROPion (WELLBUTRIN XL) 150 MG 24 hr tablet Take 150 mg daily by mouth.     Continuous Blood Gluc Sensor (FREESTYLE LIBRE 14 DAY SENSOR) MISC See admin instructions.     Empagliflozin-linaGLIPtin 25-5 MG TABS Take 1 tablet by mouth daily.  glipiZIDE (GLUCOTROL) 10 MG tablet Take 10 mg by mouth 2 (two) times daily before a meal.     lisinopril (PRINIVIL,ZESTRIL) 5 MG tablet Take 5 mg by mouth daily.     loratadine (CLARITIN) 10 MG tablet Take 10 mg by mouth daily.     metFORMIN (GLUCOPHAGE) 500 MG tablet Take 500-1,000 mg See admin instructions by mouth. Take 1000 mg in the morning and 500 mg in the evening     metoprolol tartrate (LOPRESSOR) 100 MG tablet Take one tablet by mouth 2 hours prior to your CT 1 tablet 0   Multiple Vitamin (MULTIVITAMIN) capsule Take 1 capsule by mouth daily.     simvastatin (ZOCOR) 80 MG tablet Take 40 mg by mouth at bedtime.     sodium chloride (OCEAN) 0.65 % SOLN nasal spray Place 2 sprays into both nostrils as  needed for congestion.     No current facility-administered medications for this visit.    Allergies as of 01/15/2021 - Review Complete 01/15/2021  Allergen Reaction Noted   Effexor [venlafaxine]  07/12/2018   Metformin and related Diarrhea 07/12/2018   Sudafed [pseudoephedrine hcl]  07/12/2018    Family History  Problem Relation Age of Onset   Diabetes Father    Lung cancer Father    Heart failure Mother    Colon cancer Neg Hx    Esophageal cancer Neg Hx    Stomach cancer Neg Hx    Rectal cancer Neg Hx    Colon polyps Neg Hx     Social History   Socioeconomic History   Marital status: Married    Spouse name: Not on file   Number of children: Not on file   Years of education: Not on file   Highest education level: Not on file  Occupational History   Not on file  Tobacco Use   Smoking status: Never   Smokeless tobacco: Never  Vaping Use   Vaping Use: Never used  Substance and Sexual Activity   Alcohol use: Yes    Comment: DAILY- 2 glasses of wine   Drug use: No   Sexual activity: Yes  Other Topics Concern   Not on file  Social History Narrative   Not on file   Social Determinants of Health   Financial Resource Strain: Not on file  Food Insecurity: Not on file  Transportation Needs: Not on file  Physical Activity: Not on file  Stress: Not on file  Social Connections: Not on file  Intimate Partner Violence: Not on file    Review of Systems:    Constitutional: No weight loss, fever or chills Cardiovascular: No chest pain  Respiratory: No SOB  Gastrointestinal: See HPI and otherwise negative   Physical Exam:  Vital signs: BP 122/72   Pulse 85   Ht 5\' 9"  (1.753 m)   Wt 162 lb 8 oz (73.7 kg)   BMI 24.00 kg/m   Constitutional:   Pleasant Caucasian male appears to be in NAD, Well developed, Well nourished, alert and cooperative Respiratory: Respirations even and unlabored. Lungs clear to auscultation bilaterally.   No wheezes, crackles, or rhonchi.   Cardiovascular: Normal S1, S2. No MRG. Regular rate and rhythm. No peripheral edema, cyanosis or pallor.  Gastrointestinal:  Soft, nondistended, nontender. No rebound or guarding. Normal bowel sounds. No appreciable masses or hepatomegaly. Rectal:  Not performed.  Psychiatric: Demonstrates good judgement and reason without abnormal affect or behaviors.  No recent labs.  Assessment: 1.  Dysphagia: New symptom with occasional  reflux over the past few months, with history of Barrett's esophagus will rule out stricture versus cancer vs other 2.  History of Barrett's esophagus: Last EGD in June 2021 with Barrett's, repeat for surveillance was recommended in 3 years 3.  Screening for colorectal cancer: Last colonoscopy normal in 2012  Plan: 1.  Scheduled patient for an EGD with possible dilation and screening colonoscopy in the [place] with Dr. Hilarie Chambers.  Did provide the patient a detailed list risks for the procedure and he agrees to proceed. Patient is appropriate for endoscopic procedure(s) in the ambulatory ([place]) setting.  2.  Started the patient on Omeprazole 20 mg once daily, 30 to 60 minutes for breakfast #30 with 11 refills.  Discussed that he should really remain on a PPI given his history of Barrett's esophagus. 3.  Reviewed anti-dysphagia measures 4.  Patient to follow in clinic per recommendations after procedures above.  Ellouise Newer, PA-C [facility] 01/15/2021, 8:30 AM  Cc: Alexander Neer, MD

## 2021-02-04 NOTE — Progress Notes (Signed)
Addendum: Reviewed and agree with assessment and management plan. Sabastion Hrdlicka M, MD  

## 2021-03-10 ENCOUNTER — Encounter: Payer: Self-pay | Admitting: Internal Medicine

## 2021-03-13 ENCOUNTER — Encounter: Payer: Self-pay | Admitting: Internal Medicine

## 2021-03-13 ENCOUNTER — Other Ambulatory Visit: Payer: Self-pay

## 2021-03-13 ENCOUNTER — Ambulatory Visit (AMBULATORY_SURGERY_CENTER): Payer: Commercial Managed Care - PPO | Admitting: Internal Medicine

## 2021-03-13 VITALS — BP 124/78 | HR 64 | Temp 98.0°F | Resp 19 | Ht 69.0 in | Wt 162.0 lb

## 2021-03-13 DIAGNOSIS — K219 Gastro-esophageal reflux disease without esophagitis: Secondary | ICD-10-CM

## 2021-03-13 DIAGNOSIS — D123 Benign neoplasm of transverse colon: Secondary | ICD-10-CM

## 2021-03-13 DIAGNOSIS — Z1211 Encounter for screening for malignant neoplasm of colon: Secondary | ICD-10-CM

## 2021-03-13 DIAGNOSIS — K2270 Barrett's esophagus without dysplasia: Secondary | ICD-10-CM | POA: Diagnosis not present

## 2021-03-13 DIAGNOSIS — R1310 Dysphagia, unspecified: Secondary | ICD-10-CM

## 2021-03-13 DIAGNOSIS — K2970 Gastritis, unspecified, without bleeding: Secondary | ICD-10-CM

## 2021-03-13 DIAGNOSIS — D122 Benign neoplasm of ascending colon: Secondary | ICD-10-CM | POA: Diagnosis not present

## 2021-03-13 MED ORDER — SODIUM CHLORIDE 0.9 % IV SOLN: 500.0000 mL | Freq: Once | INTRAVENOUS | Status: AC

## 2021-03-13 NOTE — Op Note (Signed)
[facility] Patient Name: Alexander Chambers Procedure Date: 03/13/2021 11:36 AM MRN: [id number] Endoscopist: Jerene Bears , MD Age: 65 Referring MD:  Date of Birth: [birth year] Gender: Male Account #: [id number] Procedure:                Colonoscopy Indications:              Screening for colorectal malignant neoplasm, Last                            colonoscopy 10 years ago Medicines:                Monitored Anesthesia Care Procedure:                Pre-Anesthesia Assessment:                           - Prior to the procedure, a History and Physical                            was performed, and patient medications and                            allergies were reviewed. The patient's tolerance of                            previous anesthesia was also reviewed. The risks                            and benefits of the procedure and the sedation                            options and risks were discussed with the patient.                            All questions were answered, and informed consent                            was obtained. Prior Anticoagulants: The patient has                            taken no previous anticoagulant or antiplatelet                            agents. ASA Grade Assessment: II - A patient with                            mild systemic disease. After reviewing the risks                            and benefits, the patient was deemed in                            satisfactory condition to undergo the procedure.  After obtaining informed consent, the colonoscope                            was passed under direct vision. Throughout the                            procedure, the patient's blood pressure, pulse, and                            oxygen saturations were monitored continuously. The                            Olympus CF-HQ190L (Serial# [id number]) Colonoscope was                            introduced through the anus and  advanced to the                            cecum, identified by appendiceal orifice and                            ileocecal valve. The colonoscopy was performed                            without difficulty. The patient tolerated the                            procedure well. The quality of the bowel                            preparation was good (though there was retained                            organic matter which could not be cleared with                            suction/lavage). The ileocecal valve, appendiceal                            orifice, and rectum were photographed. Scope In: 11:57:57 AM Scope Out: 12:20:34 PM Scope Withdrawal Time: 0 hours 19 minutes 30 seconds  Total Procedure Duration: 0 hours 22 minutes 37 seconds  Findings:                 The digital rectal exam was normal.                           A 2 mm polyp was found in the ascending colon. The                            polyp was sessile. The polyp was removed with a                            cold biopsy forceps. Resection and retrieval were  complete.                           A 5 mm polyp was found in the ascending colon. The                            polyp was sessile. The polyp was removed with a                            cold snare. Resection was complete, but the polyp                            tissue was not retrieved.                           A 5 mm polyp was found in the transverse colon. The                            polyp was sessile. The polyp was removed with a                            cold snare. Resection and retrieval were complete.                           Multiple small-mouthed diverticula were found in                            the sigmoid colon and descending colon.                           The retroflexed view of the distal rectum and anal                            verge was normal and showed no anal or rectal                             abnormalities. Complications:            No immediate complications. Estimated Blood Loss:     Estimated blood loss was minimal. Impression:               - One 2 mm polyp in the ascending colon, removed                            with a cold biopsy forceps. Resected and retrieved.                           - One 5 mm polyp in the ascending colon, removed                            with a cold snare. Complete resection. Polyp tissue                            not retrieved.                           -  One 5 mm polyp in the transverse colon, removed                            with a cold snare. Resected and retrieved.                           - Diverticulosis in the sigmoid colon and in the                            descending colon.                           - The distal rectum and anal verge are normal on                            retroflexion view. Recommendation:           - Patient has a contact number available for                            emergencies. The signs and symptoms of potential                            delayed complications were discussed with the                            patient. Return to normal activities tomorrow.                            Written discharge instructions were provided to the                            patient.                           - Resume previous diet.                           - Continue present medications.                           - Await pathology results.                           - Repeat colonoscopy is recommended. The                            colonoscopy date will be determined after pathology                            results from today's exam become available for                            review. Jerene Bears, MD 03/13/2021 12:38:48 PM This report has been signed electronically.

## 2021-03-13 NOTE — Progress Notes (Signed)
GASTROENTEROLOGY PROCEDURE H&P NOTE   Primary Care Physician: Mayra Neer, MD    Reason for Procedure:  History of GERD and Barrett's esophagus without dysplasia, dysphagia and colon cancer screening  Plan:    EGD with possible dilation and colonoscopy  Patient is appropriate for endoscopic procedure(s) in the ambulatory ([place]) setting.  The nature of the procedure, as well as the risks, benefits, and alternatives were carefully and thoroughly reviewed with the patient. Ample time for discussion and questions allowed. The patient understood, was satisfied, and agreed to proceed.     HPI: Alexander Chambers is a 65 y.o. male who presents for EGD and colonoscopy.  Seen in the office just under 2 months ago by Ellouise Newer, PA-C.  See that note for details.  No significant change in medical history since that time.  No recent chest pain or shortness of breath.  Tolerated the prep.  No abdominal pain today.  Past Medical History:  Diagnosis Date   Allergic rhinitis due to pollen    Allergy    Anxiety, generalized    Arthritis    Barrett's esophagus    Cholelithiasis    Diarrhea    Diverticulosis    Diverticulosis    DM    type 2   Dysrhythmia    SVT   ED (erectile dysfunction)    Fatty liver    GASTRITIS    GERD    Hepatic steatosis    Hiatal hernia    History of hiatal hernia    Insomnia    Irritable bowel syndrome    Mixed hyperlipidemia    Obstructive sleep apnea (adult) (pediatric)    SVT (supraventricular tachycardia) (HCC)    Tinnitus    Type 2 diabetes mellitus with other specified complication [facility])     Past Surgical History:  Procedure Laterality Date   CARPAL TUNNEL RELEASE Right    ELBOW SURGERY Right    ESOPHAGEAL MANOMETRY N/A 12/08/2016   Procedure: ESOPHAGEAL MANOMETRY (EM);  Surgeon: Jerene Bears, MD;  Location: [facility];  Service: Gastroenterology;  Laterality: N/A;   HERNIA REPAIR     failed surgery   LAPAROSCOPIC NISSEN  FUNDOPLICATION N/A 93/23/5573   Procedure: LAPAROSCOPIC REDO NISSEN FUNDOPLICATION, UPPER ENDOSCOPY;  Surgeon: Johnathan Hausen, MD;  Location: [facility] ORS;  Service: General;  Laterality: N/A;   SUPRAVENTRICULAR TACHYCARDIA ABLATION N/A 10/13/2011   Procedure: SUPRAVENTRICULAR TACHYCARDIA ABLATION;  Surgeon: Evans Lance, MD;  Location: [facility] CATH LAB;  Service: Cardiovascular;  Laterality: N/A;   THORACIC DISC SURGERY     UPPER GASTROINTESTINAL ENDOSCOPY      Prior to Admission medications   Medication Sig Start Date End Date Taking? Authorizing Provider  aspirin EC 81 MG tablet Take 81 mg by mouth daily.   Yes [provider]  buPROPion (WELLBUTRIN XL) 150 MG 24 hr tablet Take 150 mg daily by mouth.   Yes [provider]  Continuous Blood Gluc Sensor (FREESTYLE LIBRE 14 DAY SENSOR) MISC See admin instructions. 07/25/19  Yes [provider]  glipiZIDE (GLUCOTROL) 10 MG tablet Take 10 mg by mouth 2 (two) times daily before a meal.   Yes [provider]  lisinopril (PRINIVIL,ZESTRIL) 5 MG tablet Take 5 mg by mouth daily.   Yes [provider]  loratadine (CLARITIN) 10 MG tablet Take 10 mg by mouth daily.   Yes [provider]  metFORMIN (GLUCOPHAGE) 500 MG tablet Take 500-1,000 mg See admin instructions by mouth. Take 1000 mg in the  morning and 500 mg in the evening   Yes [provider]  omeprazole (PRILOSEC) 20 MG capsule Take 1 capsule (20 mg total) by mouth daily. 01/15/21  Yes Levin Erp, PA  simvastatin (ZOCOR) 80 MG tablet Take 40 mg by mouth at bedtime.   Yes [provider]  acetaminophen (TYLENOL) 650 MG CR tablet Take 650 mg by mouth every 8 (eight) hours as needed for pain.    [provider]  Empagliflozin-linaGLIPtin 25-5 MG TABS Take 1 tablet by mouth daily.    [provider]  Multiple Vitamin (MULTIVITAMIN) capsule Take 1 capsule by mouth daily.    [provider]  sodium  chloride (OCEAN) 0.65 % SOLN nasal spray Place 2 sprays into both nostrils as needed for congestion.    [provider]  Testosterone 40.5 MG/2.5GM (1.62%) GEL 1 packet to skin in the morning to shoulder and upper arms 02/25/21   [provider]    Current Outpatient Medications  Medication Sig Dispense Refill   aspirin EC 81 MG tablet Take 81 mg by mouth daily.     buPROPion (WELLBUTRIN XL) 150 MG 24 hr tablet Take 150 mg daily by mouth.     Continuous Blood Gluc Sensor (FREESTYLE LIBRE 14 DAY SENSOR) MISC See admin instructions.     glipiZIDE (GLUCOTROL) 10 MG tablet Take 10 mg by mouth 2 (two) times daily before a meal.     lisinopril (PRINIVIL,ZESTRIL) 5 MG tablet Take 5 mg by mouth daily.     loratadine (CLARITIN) 10 MG tablet Take 10 mg by mouth daily.     metFORMIN (GLUCOPHAGE) 500 MG tablet Take 500-1,000 mg See admin instructions by mouth. Take 1000 mg in the morning and 500 mg in the evening     omeprazole (PRILOSEC) 20 MG capsule Take 1 capsule (20 mg total) by mouth daily. 30 capsule 11   simvastatin (ZOCOR) 80 MG tablet Take 40 mg by mouth at bedtime.     acetaminophen (TYLENOL) 650 MG CR tablet Take 650 mg by mouth every 8 (eight) hours as needed for pain.     Empagliflozin-linaGLIPtin 25-5 MG TABS Take 1 tablet by mouth daily.     Multiple Vitamin (MULTIVITAMIN) capsule Take 1 capsule by mouth daily.     sodium chloride (OCEAN) 0.65 % SOLN nasal spray Place 2 sprays into both nostrils as needed for congestion.     Testosterone 40.5 MG/2.5GM (1.62%) GEL 1 packet to skin in the morning to shoulder and upper arms     Current Facility-Administered Medications  Medication Dose Route Frequency Provider Last Rate Last Admin   0.9 %  sodium chloride infusion  500 mL Intravenous Once Neya Creegan, Lajuan Lines, MD        Allergies as of 03/13/2021 - Review Complete 03/13/2021  Allergen Reaction Noted   Effexor [venlafaxine]  07/12/2018   Metformin and related Diarrhea  07/12/2018   Sudafed [pseudoephedrine hcl]  07/12/2018    Family History  Problem Relation Age of Onset   Diabetes Father    Lung cancer Father    Heart failure Mother    Colon cancer Neg Hx    Esophageal cancer Neg Hx    Stomach cancer Neg Hx    Rectal cancer Neg Hx    Colon polyps Neg Hx     Social History   Socioeconomic History   Marital status: Married    Spouse name: Not on file   Number of children: Not on file  Years of education: Not on file   Highest education level: Not on file  Occupational History   Not on file  Tobacco Use   Smoking status: Never   Smokeless tobacco: Never  Vaping Use   Vaping Use: Never used  Substance and Sexual Activity   Alcohol use: Yes    Comment: DAILY- 2 glasses of wine   Drug use: No   Sexual activity: Yes  Other Topics Concern   Not on file  Social History Narrative   Not on file   Social Determinants of Health   Financial Resource Strain: Not on file  Food Insecurity: Not on file  Transportation Needs: Not on file  Physical Activity: Not on file  Stress: Not on file  Social Connections: Not on file  Intimate Partner Violence: Not on file    Physical Exam: Vital signs in last 24 hours: @BP  133/84   Pulse 78   Temp 98 F (36.7 C)   Ht 5\' 9"  (1.753 m)   Wt 162 lb (73.5 kg)   SpO2 96%   BMI 23.92 kg/m  GEN: NAD EYE: Sclerae anicteric ENT: MMM CV: Non-tachycardic Pulm: CTA b/l GI: Soft, NT/ND NEURO:  Alert & Oriented x 3   Zenovia Jarred, MD [facility]  03/13/2021 11:28 AM

## 2021-03-13 NOTE — Progress Notes (Signed)
Transported to PACU, VSS 

## 2021-03-13 NOTE — Progress Notes (Signed)
Called to room to assist during endoscopic procedure.  Patient ID and intended procedure confirmed with present staff. Received instructions for my participation in the procedure from the performing physician.  

## 2021-03-13 NOTE — Op Note (Signed)
[place] Patient Name: Alexander Chambers Procedure Date: 03/13/2021 11:37 AM MRN: [id number] Endoscopist: Jerene Bears , MD Age: 65 Referring MD:  Date of Birth: July 15, 1955 Gender: Male Account #: [id number] Procedure:                Upper GI endoscopy Indications:              Dysphagia, Gastro-esophageal reflux disease,                            Follow-up of Barrett's esophagus, hx of Nissen                            fundoplication Medicines:                Monitored Anesthesia Care Procedure:                Pre-Anesthesia Assessment:                           - Prior to the procedure, a History and Physical                            was performed, and patient medications and                            allergies were reviewed. The patient's tolerance of                            previous anesthesia was also reviewed. The risks                            and benefits of the procedure and the sedation                            options and risks were discussed with the patient.                            All questions were answered, and informed consent                            was obtained. Prior Anticoagulants: The patient has                            taken no previous anticoagulant or antiplatelet                            agents. ASA Grade Assessment: II - A patient with                            mild systemic disease. After reviewing the risks                            and benefits, the patient was deemed in  satisfactory condition to undergo the procedure.                           After obtaining informed consent, the endoscope was                            passed under direct vision. Throughout the                            procedure, the patient's blood pressure, pulse, and                            oxygen saturations were monitored continuously. The                            #4314 Olympus Endoscope was introduced through the                             mouth, and advanced to the second part of duodenum.                            The upper GI endoscopy was accomplished without                            difficulty. The patient tolerated the procedure                            well. Scope In: Scope Out: Findings:                 The esophagus and gastroesophageal junction were                            examined with white light and narrow band imaging                            (NBI) from a forward view and retroflexed position.                            There were esophageal mucosal changes consistent                            with Barrett's esophagus. These changes involved                            the mucosa at the upper extent of the gastric folds                            (38 cm from the incisors) extending to the Z-line                            (34 cm from the incisors). Circumferential  salmon-colored mucosa was present from 37 to 38 cm,                            two tongues of salmon-colored mucosa were present                            from 35 to 36 cm and squamous islands were present                            at 34 cm. The maximum longitudinal extent of these                            esophageal mucosal changes was 4 cm in length.                            Mucosa was biopsied with a cold forceps for                            histology in a targeted manner at intervals of 1-2                            cm in the lower third of the esophagus. A total of                            3 specimen bottles were sent to pathology                            (esophagus at 38, 36/35 and 34 cm, Jars 2, 3, and 4                            respectively).                           Evidence of a Nissen fundoplication was found in                            the cardia. The wrap appeared intact.                           Mild inflammation characterized by congestion                             (edema) and erythema was found in the cardia and in                            the gastric fundus near the wrap. Biopsies were                            taken with a cold forceps for histology.                           The exam of the stomach was otherwise normal.  The examined duodenum was normal. Complications:            No immediate complications. Estimated Blood Loss:     Estimated blood loss was minimal. Impression:               - Esophageal mucosal changes consistent with                            Barrett's esophagus. Biopsied.                           - A Nissen fundoplication was found. The wrap                            appears intact.                           - Proximal gastritis. Biopsied.                           - Normal examined duodenum. Recommendation:           - Patient has a contact number available for                            emergencies. The signs and symptoms of potential                            delayed complications were discussed with the                            patient. Return to normal activities tomorrow.                            Written discharge instructions were provided to the                            patient.                           - Resume previous diet.                           - Continue present medications.                           - Await pathology results.                           - Barium esophagram with tablet to further evaluate                            dysphagia - no stricture seen and the Nissen wrap                            while intact is not overly tight. Jerene Bears, MD 03/13/2021 12:36:08 PM This report has been signed electronically.

## 2021-03-13 NOTE — Patient Instructions (Signed)
Discharge instructions given. Handouts on polyps,diverticulosis,Gastritis and Barretts. Resume previous medications. Office will call to schedule Barium esophagram with tablet to further evaluate dysphagia. YOU HAD AN ENDOSCOPIC PROCEDURE TODAY AT [facility]:   Refer to the procedure report that was given to you for any specific questions about what was found during the examination.  If the procedure report does not answer your questions, please call your gastroenterologist to clarify.  If you requested that your care partner not be given the details of your procedure findings, then the procedure report has been included in a sealed envelope for you to review at your convenience later.  YOU SHOULD EXPECT: Some feelings of bloating in the abdomen. Passage of more gas than usual.  Walking can help get rid of the air that was put into your GI tract during the procedure and reduce the bloating. If you had a lower endoscopy (such as a colonoscopy or flexible sigmoidoscopy) you may notice spotting of blood in your stool or on the toilet paper. If you underwent a bowel prep for your procedure, you may not have a normal bowel movement for a few days.  Please Note:  You might notice some irritation and congestion in your nose or some drainage.  This is from the oxygen used during your procedure.  There is no need for concern and it should clear up in a day or so.  SYMPTOMS TO REPORT IMMEDIATELY:  Following lower endoscopy (colonoscopy or flexible sigmoidoscopy):  Excessive amounts of blood in the stool  Significant tenderness or worsening of abdominal pains  Swelling of the abdomen that is new, acute  Fever of 100F or higher  Following upper endoscopy (EGD)  Vomiting of blood or coffee ground material  New chest pain or pain under the shoulder blades  Painful or persistently difficult swallowing  New shortness of breath  Fever of 100F or higher  Black, tarry-looking stools  For  urgent or emergent issues, a gastroenterologist can be reached at any hour by calling [phone]. Do not use MyChart messaging for urgent concerns.    DIET:  We do recommend a small meal at first, but then you may proceed to your regular diet.  Drink plenty of fluids but you should avoid alcoholic beverages for 24 hours.  ACTIVITY:  You should plan to take it easy for the rest of today and you should NOT DRIVE or use heavy machinery until tomorrow (because of the sedation medicines used during the test).    FOLLOW UP: Our staff will call the number listed on your records 48-72 hours following your procedure to check on you and address any questions or concerns that you may have regarding the information given to you following your procedure. If we do not reach you, we will leave a message.  We will attempt to reach you two times.  During this call, we will ask if you have developed any symptoms of COVID 19. If you develop any symptoms (ie: fever, flu-like symptoms, shortness of breath, cough etc.) before then, please call [phone].  If you test positive for Covid 19 in the 2 weeks post procedure, please call and report this information to [place].    If any biopsies were taken you will be contacted by phone or by letter within the next 1-3 weeks.  Please call us at [phone] if you have not heard about the biopsies in 3 weeks.    SIGNATURES/CONFIDENTIALITY: You and/or your care partner have signed paperwork  which will be entered into your electronic medical record.  These signatures attest to the fact that that the information above on your After Visit Summary has been reviewed and is understood.  Full responsibility of the confidentiality of this discharge information lies with you and/or your care-partner.

## 2021-03-16 ENCOUNTER — Other Ambulatory Visit: Payer: Self-pay

## 2021-03-16 DIAGNOSIS — R1310 Dysphagia, unspecified: Secondary | ICD-10-CM

## 2021-03-17 ENCOUNTER — Telehealth: Payer: Self-pay

## 2021-03-17 NOTE — Telephone Encounter (Signed)
  Follow up Call-  Call back number 03/13/2021 10/08/2019  Post procedure Call Back phone  # [phone]  Permission to leave phone message Yes Yes  Some recent data might be hidden     Patient questions:  Do you have a fever, pain , or abdominal swelling? No. Pain Score  0 *  Have you tolerated food without any problems? Yes.    Have you been able to return to your normal activities? Yes.    Do you have any questions about your discharge instructions: Diet   No. Medications  No. Follow up visit  No.  Do you have questions or concerns about your Care? No.  Actions: * If pain score is 4 or above: No action needed, pain <4.

## 2021-03-19 ENCOUNTER — Encounter: Payer: Self-pay | Admitting: Internal Medicine

## 2021-03-30 ENCOUNTER — Other Ambulatory Visit: Payer: Self-pay

## 2021-03-30 ENCOUNTER — Other Ambulatory Visit: Payer: Self-pay | Admitting: Internal Medicine

## 2021-03-30 ENCOUNTER — Ambulatory Visit
Admission: RE | Admit: 2021-03-30 | Discharge: 2021-03-30 | Disposition: A | Discharge status: Home / Self Care | Payer: Commercial Managed Care - PPO | Source: Ambulatory Visit | Attending: Internal Medicine | Admitting: Internal Medicine

## 2021-03-30 DIAGNOSIS — R1310 Dysphagia, unspecified: Secondary | ICD-10-CM | POA: Insufficient documentation

## 2021-10-22 ENCOUNTER — Other Ambulatory Visit: Payer: Self-pay | Admitting: Physician Assistant

## 2022-01-24 ENCOUNTER — Other Ambulatory Visit: Payer: Self-pay | Admitting: Physician Assistant

## 2022-04-21 ENCOUNTER — Other Ambulatory Visit: Payer: Self-pay | Admitting: Physician Assistant

## 2022-07-19 ENCOUNTER — Other Ambulatory Visit: Payer: Self-pay | Admitting: Physician Assistant

## 2022-08-19 ENCOUNTER — Other Ambulatory Visit: Payer: Self-pay | Admitting: Physician Assistant

## 2022-09-20 ENCOUNTER — Other Ambulatory Visit: Payer: Self-pay | Admitting: Physician Assistant

## 2022-11-10 ENCOUNTER — Encounter: Payer: Self-pay | Admitting: Physician Assistant

## 2022-11-10 ENCOUNTER — Ambulatory Visit: Payer: BLUE CROSS/BLUE SHIELD | Admitting: Physician Assistant

## 2022-11-10 VITALS — BP 130/80 | HR 83 | Ht 69.0 in | Wt 163.0 lb

## 2022-11-10 DIAGNOSIS — R1310 Dysphagia, unspecified: Secondary | ICD-10-CM | POA: Diagnosis not present

## 2022-11-10 DIAGNOSIS — K219 Gastro-esophageal reflux disease without esophagitis: Secondary | ICD-10-CM | POA: Diagnosis not present

## 2022-11-10 DIAGNOSIS — Z86010 Personal history of colonic polyps: Secondary | ICD-10-CM | POA: Diagnosis not present

## 2022-11-10 DIAGNOSIS — Z8719 Personal history of other diseases of the digestive system: Secondary | ICD-10-CM | POA: Diagnosis not present

## 2022-11-10 MED ORDER — OMEPRAZOLE 20 MG PO CPDR: 20.0000 mg | DELAYED_RELEASE_CAPSULE | Freq: Every day | ORAL | 3 refills | Status: DC

## 2022-11-10 NOTE — Progress Notes (Signed)
Addendum: Reviewed and agree with assessment and management plan. Pyrtle, Jay M, MD  

## 2022-11-10 NOTE — Progress Notes (Signed)
Chief Complaint: History of Barrett's esophagus and dysphagia  HPI:    [name] is a 67 year old Caucasian male with a past medical history as listed below including Barrett's esophagus and dysphagia, known to Dr. Rhea Belton, who presents to clinic today to discuss refill of Omeprazole.    03/13/2021 EGD for dysphagia and GERD as well as follow-up of Barrett's esophagus and history of Nissen fundoplication, with esophageal mucosal changes consistent with Barrett's esophagus, Niesen fundoplication with intact wrap and proximal gastritis.  Barium esophagram with tablet recommended for further evaluation of dysphagia.  Repeat EGD recommended in 3 years for surveillance of Barrett's.    03/13/2021 colonoscopy with one 2 mm polyp in the ascending colon, one 5 mm polyp in the ascending colon, one 5 mm polyp in the transverse colon and diverticulosis in the sigmoid and descending colon.  Pathology showed tubular adenomas.  Repeat recommended in 5 years.    03/30/2021 esophagram with tablet with normal pharyngeal anatomy and motility, no evidence of stricture, esophageal dysmotility seen in the upper and midesophagus.    Today, patient presents to clinic and tells me that he has not been on Omeprazole 20 mg daily as his prescription ran out.  He is here today to discuss it.  He continues with some dysphagia issues but tells me "I was told that was just swallowing trouble that comes with old age", apparently he has altered his diet and eats less steak and dried chicken and really does not have too much of a problem.  When he does he is able to cough it out.    Denies fever, chills, weight loss or blood in the stool.   Past Medical History:  Diagnosis Date   Allergic rhinitis due to pollen    Allergy    Anxiety, generalized    Arthritis    Barrett's esophagus    Cholelithiasis    Diarrhea    Diverticulosis    Diverticulosis    DM    type 2   Dysrhythmia    SVT   ED (erectile dysfunction)    Fatty  liver    GASTRITIS    GERD    Hepatic steatosis    Hiatal hernia    History of hiatal hernia    Insomnia    Irritable bowel syndrome    Mixed hyperlipidemia    Obstructive sleep apnea (adult) (pediatric)    SVT (supraventricular tachycardia)    Tinnitus    Type 2 diabetes mellitus with other specified complication [facility])     Past Surgical History:  Procedure Laterality Date   CARPAL TUNNEL RELEASE Right    ELBOW SURGERY Right    ESOPHAGEAL MANOMETRY N/A 12/08/2016   Procedure: ESOPHAGEAL MANOMETRY (EM);  Surgeon: Beverley Fiedler, MD;  Location: [facility];  Service: Gastroenterology;  Laterality: N/A;   HERNIA REPAIR     failed surgery   LAPAROSCOPIC NISSEN FUNDOPLICATION N/A 03/11/2017   Procedure: LAPAROSCOPIC REDO NISSEN FUNDOPLICATION, UPPER ENDOSCOPY;  Surgeon: Luretha Murphy, MD;  Location: [facility];  Service: General;  Laterality: N/A;   SUPRAVENTRICULAR TACHYCARDIA ABLATION N/A 10/13/2011   Procedure: SUPRAVENTRICULAR TACHYCARDIA ABLATION;  Surgeon: Marinus Maw, MD;  Location: [facility] CATH LAB;  Service: Cardiovascular;  Laterality: N/A;   THORACIC DISC SURGERY     UPPER GASTROINTESTINAL ENDOSCOPY      Current Outpatient Medications  Medication Sig Dispense Refill   acetaminophen (TYLENOL) 650 MG CR tablet Take 650 mg by mouth every 8 (eight) hours as needed for pain.  aspirin EC 81 MG tablet Take 81 mg by mouth daily.     buPROPion (WELLBUTRIN XL) 150 MG 24 hr tablet Take 150 mg daily by mouth.     Continuous Blood Gluc Sensor (FREESTYLE LIBRE 14 DAY SENSOR) MISC See admin instructions.     Empagliflozin-linaGLIPtin 25-5 MG TABS Take 1 tablet by mouth daily.     glipiZIDE (GLUCOTROL) 10 MG tablet Take 10 mg by mouth 2 (two) times daily before a meal.     lisinopril (PRINIVIL,ZESTRIL) 5 MG tablet Take 5 mg by mouth daily.     loratadine (CLARITIN) 10 MG tablet Take 10 mg by mouth daily.     metFORMIN (GLUCOPHAGE) 500 MG tablet Take 500-1,000 mg See admin instructions by  mouth. Take 1000 mg in the morning and 500 mg in the evening     Multiple Vitamin (MULTIVITAMIN) capsule Take 1 capsule by mouth daily.     omeprazole (PRILOSEC) 20 MG capsule TAKE 1 CAPSULE BY MOUTH ONCE DAILY 30 capsule 2   simvastatin (ZOCOR) 80 MG tablet Take 40 mg by mouth at bedtime.     sodium chloride (OCEAN) 0.65 % SOLN nasal spray Place 2 sprays into both nostrils as needed for congestion.     Testosterone 40.5 MG/2.5GM (1.62%) GEL 1 packet to skin in the morning to shoulder and upper arms     Current Facility-Administered Medications  Medication Dose Route Frequency Provider Last Rate Last Admin   0.9 %  sodium chloride infusion  500 mL Intravenous Once Pyrtle, Carie Caddy, MD        Allergies as of 11/10/2022 - Review Complete 11/10/2022  Allergen Reaction Noted   Effexor [venlafaxine]  07/12/2018   Metformin and related Diarrhea 07/12/2018   Sudafed [pseudoephedrine hcl]  07/12/2018    Family History  Problem Relation Age of Onset   Diabetes Father    Lung cancer Father    Heart failure Mother    Colon cancer Neg Hx    Esophageal cancer Neg Hx    Stomach cancer Neg Hx    Rectal cancer Neg Hx    Colon polyps Neg Hx     Social History   Socioeconomic History   Marital status: Married    Spouse name: Not on file   Number of children: Not on file   Years of education: Not on file   Highest education level: Not on file  Occupational History   Not on file  Tobacco Use   Smoking status: Never   Smokeless tobacco: Never  Vaping Use   Vaping status: Never Used  Substance and Sexual Activity   Alcohol use: Yes    Comment: DAILY- 2 glasses of wine   Drug use: No   Sexual activity: Yes  Other Topics Concern   Not on file  Social History Narrative   Not on file   Social Determinants of Health   Financial Resource Strain: Not on file  Food Insecurity: Not on file  Transportation Needs: Not on file  Physical Activity: Not on file  Stress: Not on file  Social  Connections: Not on file  Intimate Partner Violence: Not on file    Review of Systems:    Constitutional: No weight loss, fever or chills Cardiovascular: No chest pain Respiratory: No SOB  Gastrointestinal: See HPI and otherwise negative   Physical Exam:  Vital signs: BP 130/80   Pulse 83   Ht 5\' 9"  (1.753 m)   Wt 163 lb (73.9 kg)  BMI 24.07 kg/m   Constitutional:   Pleasant Caucasian male appears to be in NAD, Well developed, Well nourished, alert and cooperative Respiratory: Respirations even and unlabored. Lungs clear to auscultation bilaterally.   No wheezes, crackles, or rhonchi.  Cardiovascular: Normal S1, S2. No MRG. Regular rate and rhythm. No peripheral edema, cyanosis or pallor.  Gastrointestinal:  Soft, nondistended, nontender. No rebound or guarding. Normal bowel sounds. No appreciable masses or hepatomegaly. Psychiatric: Oriented to person, place and time. Demonstrates good judgement and reason without abnormal affect or behaviors.  No recent labs or imaging.  Assessment: 1.  History of Barrett's esophagus: With symptoms of dysphagia, GERD, currently not on a PPI, last EGD in 2022 with repeat recommended in 3 years 2.  Dysphagia: Previously evaluated EGD, no stricture, Barrett's esophagus, barium swallow with dysmotility 3.  History of tubular adenomas: Repeat colonoscopy recommended in 2027  Plan: 1.  Discussed with patient again the importance of staying on Omeprazole 20 mg daily to help against prevent progression of Barrett's esophagus to esophageal cancer.  Refilled this medication #90 with 3 refills sent to his pharmacy. 2.  Discussed patient will be due for a surveillance EGD next year for his history of Barrett's esophagus. 3.  Discussed dysphagia.  If he would be interested he could benefit from speech path and their evaluation given dysmotility.  He is not interested today. 4.  Patient to follow in clinic in a year or sooner if necessary.  Hyacinth Meeker, PA-C [facility] 11/10/2022, 9:01 AM  Cc: Lupita Raider, MD

## 2022-11-10 NOTE — Patient Instructions (Addendum)
_______________________________________________________  If your blood pressure at your visit was 140/90 or greater, please contact your primary care physician to follow up on this.  _______________________________________________________  If you are age 67 or older, your body mass index should be between 23-30. Your Body mass index is 24.07 kg/m. If this is out of the aforementioned range listed, please consider follow up with your Primary Care Provider.  If you are age 49 or younger, your body mass index should be between 19-25. Your Body mass index is 24.07 kg/m. If this is out of the aformentioned range listed, please consider follow up with your Primary Care Provider.   ________________________________________________________  The [place] [facility] providers would like to encourage you to use [facility] to communicate with providers for non-urgent requests or questions.  Due to long hold times on the telephone, sending your provider a message by [facility] may be a faster and more efficient way to get a response.  Please allow 48 business hours for a response.  Please remember that this is for non-urgent requests.  _______________________________________________________   We have sent the following medications to your pharmacy for you to pick up at your convenience:  Omeprazole 20 mg  daily   It was a pleasure to see you today!  Thank you for trusting me with your gastrointestinal care!

## 2022-11-15 ENCOUNTER — Ambulatory Visit: Payer: BLUE CROSS/BLUE SHIELD | Attending: Family Medicine | Admitting: Physical Therapy

## 2022-11-15 DIAGNOSIS — M25552 Pain in left hip: Secondary | ICD-10-CM | POA: Diagnosis present

## 2022-11-15 DIAGNOSIS — M25652 Stiffness of left hip, not elsewhere classified: Secondary | ICD-10-CM | POA: Insufficient documentation

## 2022-11-15 DIAGNOSIS — M6281 Muscle weakness (generalized): Secondary | ICD-10-CM | POA: Diagnosis present

## 2022-11-16 ENCOUNTER — Encounter: Payer: Self-pay | Admitting: Physical Therapy

## 2022-11-16 NOTE — Therapy (Addendum)
OUTPATIENT PHYSICAL THERAPY LOWER EXTREMITY EVALUATION   Patient Name: Alexander Chambers MRN: [id number] DOB:May 22, 1955, 67 y.o., male Today's Date: 11/16/2022  END OF SESSION:  PT End of Session - 11/16/22 0716     Visit Number 1    Number of Visits 12    Date for PT Re-Evaluation 12/27/22    PT Start Time 1430    PT Stop Time 1520    PT Time Calculation (min) 50 min    Activity Tolerance Patient tolerated treatment well    Behavior During Therapy WFL for tasks assessed/performed             Past Medical History:  Diagnosis Date   Allergic rhinitis due to pollen    Allergy    Anxiety, generalized    Arthritis    Barrett's esophagus    Cholelithiasis    Diarrhea    Diverticulosis    Diverticulosis    DM    type 2   Dysrhythmia    SVT   ED (erectile dysfunction)    Fatty liver    GASTRITIS    GERD    Hepatic steatosis    Hiatal hernia    History of hiatal hernia    Insomnia    Irritable bowel syndrome    Mixed hyperlipidemia    Obstructive sleep apnea (adult) (pediatric)    SVT (supraventricular tachycardia)    Tinnitus    Type 2 diabetes mellitus with other specified complication (HCC)    Past Surgical History:  Procedure Laterality Date   CARPAL TUNNEL RELEASE Right    ELBOW SURGERY Right    ESOPHAGEAL MANOMETRY N/A 12/08/2016   Procedure: ESOPHAGEAL MANOMETRY (EM);  Surgeon: Beverley Fiedler, MD;  Location: [facility];  Service: Gastroenterology;  Laterality: N/A;   HERNIA REPAIR     failed surgery   LAPAROSCOPIC NISSEN FUNDOPLICATION N/A 03/11/2017   Procedure: LAPAROSCOPIC REDO NISSEN FUNDOPLICATION, UPPER ENDOSCOPY;  Surgeon: Luretha Murphy, MD;  Location: [facility]S;  Service: General;  Laterality: N/A;   SUPRAVENTRICULAR TACHYCARDIA ABLATION N/A 10/13/2011   Procedure: SUPRAVENTRICULAR TACHYCARDIA ABLATION;  Surgeon: Marinus Maw, MD;  Location: [facility] CATH LAB;  Service: Cardiovascular;  Laterality: N/A;   THORACIC DISC SURGERY     UPPER  GASTROINTESTINAL ENDOSCOPY     Patient Active Problem List   Diagnosis Date Noted   Chest pain 07/13/2018   Status post Nissen fundoplication 03/11/2017   SVT (supraventricular tachycardia) 09/14/2011   Hyperlipidemia LDL goal <70 03/24/2010   Essential hypertension 03/24/2010   GASTRITIS 03/24/2010   DIARRHEA 03/24/2010   DM 03/02/2010   GERD 03/02/2010   BARRETT'S ESOPHAGUS 08/17/2007   DIVERTICULOSIS-COLON 08/17/2007   IRRITABLE BOWEL SYNDROME 08/17/2007    PCP: Lupita Raider, MD  REFERRING PROVIDER: Irven Coe, MD  REFERRING DIAG: Piriformis syndrome, glute med tendinitis  THERAPY DIAG:  Pain in left hip  Hip joint stiffness, left  Muscle weakness (generalized)  Rationale for Evaluation and Treatment: Rehabilitation  ONSET DATE: 03/12/22  SUBJECTIVE:   SUBJECTIVE STATEMENT: Pt reports insidious onset of [name] that first started about 9 months ago and is progressively worsening with time. He denies any MOI or radicular symptoms. He reports symptoms usually bother him the most in the morning the day after doing a lot of yardwork/walking.  PERTINENT HISTORY: DM type 2, Dysrhythmias (SVT) PAIN:  Are you having pain? Yes: NPRS scale: 0/10 (now), 8-9/10 (worst) Pain location: L posterior hip Pain description: sharp Aggravating factors: Walking long distances, driving for  2-3+ hours Relieving factors: Tylenol (marginal relief)  PRECAUTIONS: None  RED FLAGS: None   WEIGHT BEARING RESTRICTIONS: No  FALLS:  Has patient fallen in last 6 months? No  LIVING ENVIRONMENT: Lives with: lives with their spouse Lives in: House/apartment Stairs: Yes: Internal: 9 steps; and External: 5 steps  OCCUPATION: Administrator, arts for [employer]  PLOF: Independent  PATIENT GOALS: Be able to walk without pain  NEXT MD VISIT: TBD  OBJECTIVE:   DIAGNOSTIC FINDINGS: N/A  PATIENT SURVEYS:  FOTO initial 38/ goal 86  COGNITION: Overall cognitive status:  Within functional limits for tasks assessed     SENSATION: WFL  MUSCLE LENGTH: Distal Hamstrings: Right 150 deg; Left 139 deg Thomas test: neutral bilaterally  POSTURE: No Significant postural limitations  PALPATION: TP at L glute max origin point, TTP  LOWER EXTREMITY ROM:  Active ROM Right eval Left eval  Hip flexion 90 100  Hip extension 10 12  Hip abduction 53 34  Hip adduction    Hip internal rotation 29 27  Hip external rotation 40 33  Knee flexion [facility] WFL  Knee extension [facility]  Ankle dorsiflexion [facility] WFL  Ankle plantarflexion    Ankle inversion    Ankle eversion     (Blank rows = not tested)  LOWER EXTREMITY MMT:  MMT Right eval Left eval  Hip flexion 4+ 4+  Hip extension 4- 4-  Hip abduction 4 4  Hip adduction    Hip internal rotation 5 5 !  Hip external rotation 4+ 4+  Knee flexion 5 5  Knee extension 5 5  Ankle dorsiflexion 5 5  Ankle plantarflexion    Ankle inversion    Ankle eversion     (Blank rows = not tested)  LOWER EXTREMITY SPECIAL TESTS:  Hip special tests: Luisa Hart (FABER) test: negative, Trendelenburg test: negative, Thomas test: negative, and Piriformis test: negative, FADIR test: negative  FUNCTIONAL TESTS:  6 minute walk test: TBD  GAIT: Distance walked: 100 feet Assistive device utilized: None Level of assistance: Complete Independence Comments: Normalized gait pattern, WFL.   TODAY'S TREATMENT:                                                                                                                              DATE: 11/15/2022   Evaluation/ See HEP  PATIENT EDUCATION:  Education details: Self-release of TP, use of ice/heat at home to manage pain/symptoms Person educated: Patient Education method: Explanation and Demonstration Education comprehension: verbalized understanding and returned demonstration  HOME EXERCISE PROGRAM: Supine piriformis stretch: 3 x 30 second holds, 1x/day, 7x/week  Seated  piriformis stretch: 3 x 30 second holds, 1x/day, 7x/week  Supine TP release with tennis/lacrosse ball: 4x30 second holds, PRN  Squat with blue resistance band around knees, 2 x 15, 1x/day, 7x/week  ASSESSMENT:  CLINICAL IMPRESSION: Patient is a 67 y.o. male who was seen today for physical therapy evaluation and treatment for L posterior hip pain.  Pt's impairments currently limiting ADL/IADL performance include: B hip muscle weakness and tightness, trigger points to L glute max. Functional impairments include decreased walking/activity tolerance, difficulty driving. Pt will benefit from skilled PT services to address the above impairments and get back to walking/yard work with no pain.    OBJECTIVE IMPAIRMENTS: decreased activity tolerance, decreased ROM, and decreased strength.   ACTIVITY LIMITATIONS:  Walking/driving long distances  PARTICIPATION LIMITATIONS: driving and yard work  PERSONAL FACTORS: Time since onset of injury/illness/exacerbation and 1-2 comorbidities: DM T2, dysrhythmias (SVT)  are also affecting patient's functional outcome.   REHAB POTENTIAL: Good  CLINICAL DECISION MAKING: Stable/uncomplicated  EVALUATION COMPLEXITY: Low   GOALS:  SHORT TERM GOALS: Target date: 12/06/2022  1.  Pt independent with HEP to increase L hip flexion >110 deg. To improve  independence with self-management of pain/symptoms. Baseline: see above Goal status: INITIAL   LONG TERM GOALS: Target date: 12/27/2022  1.  Pt will improve FOTO score to 57 in order to show improvement of pain and performance of ADLs. Baseline:  38 Goal status: INITIAL  2.  Pt will increase all hip MMT scores to at least 4+/5 MMT with no pain in order to allow for improved ADL/IADL performance. Baseline: Extension (4-) bilaterally, abduction (4) bilaterally, L IR (5) but painful Goal status: INITIAL  3.  Pt will report being able to walk > 30 minutes with no hip symptoms to be able to get back to regular  walking routine. Baseline: Pt unable to give exact timeframe of when pain comes on but describes it as "soon." Goal status: INITIAL  4.  Pt will demonstrate ability to independently manage treatment of trigger points in order to participate in yardwork/walking with no pain. Baseline: TP release included in initial HEP, pt required demonstration and verbal cueing Goal status: INITIAL  5.  Pt will be able to perform squat with no knee valgus in order to demonstrate improved hip muscle strength and bending/lifting tasks. Baseline: Bilateral knee valgus present with squat Goal status: INITIAL   PLAN:  PT FREQUENCY: 2x/week  PT DURATION: 6 weeks  PLANNED INTERVENTIONS: Therapeutic exercises, Therapeutic activity, Neuromuscular re-education, Patient/Family education, Self Care, Joint mobilization, Dry Needling, Cryotherapy, and Moist heat  PLAN FOR NEXT SESSION: Introduce hip/core strengthening exercises, STM to L hip/glute musculature, dry needling  Cammie Mcgee, PT, DPT # [phone] 11/16/2022, 9:59 AM

## 2022-11-17 ENCOUNTER — Encounter: Payer: Commercial Managed Care - PPO | Admitting: Physical Therapy

## 2022-11-24 ENCOUNTER — Ambulatory Visit: Payer: BLUE CROSS/BLUE SHIELD | Admitting: Physical Therapy

## 2022-11-24 ENCOUNTER — Encounter: Payer: Self-pay | Admitting: Physical Therapy

## 2022-11-24 DIAGNOSIS — M25552 Pain in left hip: Secondary | ICD-10-CM

## 2022-11-24 DIAGNOSIS — M6281 Muscle weakness (generalized): Secondary | ICD-10-CM

## 2022-11-24 DIAGNOSIS — M25652 Stiffness of left hip, not elsewhere classified: Secondary | ICD-10-CM

## 2022-11-24 NOTE — Therapy (Signed)
OUTPATIENT PHYSICAL THERAPY LOWER EXTREMITY TREATMENT   Patient Name: Alexander Chambers MRN: [id number] DOB:[birth year], 67 y.o., male Today's Date: 11/24/2022  END OF SESSION:  PT End of Session - 11/24/22 1506     Visit Number 2    Number of Visits 12    Date for PT Re-Evaluation 12/27/22    PT Start Time 1115    PT Stop Time 1155    PT Time Calculation (min) 40 min    Activity Tolerance Patient tolerated treatment well    Behavior During Therapy WFL for tasks assessed/performed              Past Medical History:  Diagnosis Date   Allergic rhinitis due to pollen    Allergy    Anxiety, generalized    Arthritis    Barrett's esophagus    Cholelithiasis    Diarrhea    Diverticulosis    Diverticulosis    DM    type 2   Dysrhythmia    SVT   ED (erectile dysfunction)    Fatty liver    GASTRITIS    GERD    Hepatic steatosis    Hiatal hernia    History of hiatal hernia    Insomnia    Irritable bowel syndrome    Mixed hyperlipidemia    Obstructive sleep apnea (adult) (pediatric)    SVT (supraventricular tachycardia)    Tinnitus    Type 2 diabetes mellitus with other specified complication (HCC)    Past Surgical History:  Procedure Laterality Date   CARPAL TUNNEL RELEASE Right    ELBOW SURGERY Right    ESOPHAGEAL MANOMETRY N/A 12/08/2016   Procedure: ESOPHAGEAL MANOMETRY (EM);  Surgeon: Alexander Fiedler, MD;  Location: [facility];  Service: Gastroenterology;  Laterality: N/A;   HERNIA REPAIR     failed surgery   LAPAROSCOPIC NISSEN FUNDOPLICATION N/A 03/11/2017   Procedure: LAPAROSCOPIC REDO NISSEN FUNDOPLICATION, UPPER ENDOSCOPY;  Surgeon: Alexander Murphy, MD;  Location: [facility];  Service: General;  Laterality: N/A;   SUPRAVENTRICULAR TACHYCARDIA ABLATION N/A 10/13/2011   Procedure: SUPRAVENTRICULAR TACHYCARDIA ABLATION;  Surgeon: Alexander Maw, MD;  Location: [facility] CATH LAB;  Service: Cardiovascular;  Laterality: N/A;   THORACIC DISC SURGERY     UPPER  GASTROINTESTINAL ENDOSCOPY     Patient Active Problem List   Diagnosis Date Noted   Chest pain 07/13/2018   Status post Nissen fundoplication 03/11/2017   SVT (supraventricular tachycardia) 09/14/2011   Hyperlipidemia LDL goal <70 03/24/2010   Essential hypertension 03/24/2010   GASTRITIS 03/24/2010   DIARRHEA 03/24/2010   DM 03/02/2010   GERD 03/02/2010   BARRETT'S ESOPHAGUS 08/17/2007   DIVERTICULOSIS-COLON 08/17/2007   IRRITABLE BOWEL SYNDROME 08/17/2007    PCP: Alexander Raider, MD  REFERRING PROVIDER: Irven Coe, MD  REFERRING DIAG: Piriformis syndrome, glute med tendinitis  THERAPY DIAG:  Pain in left hip  Hip joint stiffness, left  Muscle weakness (generalized)  Rationale for Evaluation and Treatment: Rehabilitation  ONSET DATE: 03/12/22  SUBJECTIVE:   SUBJECTIVE STATEMENT: Pt reports insidious onset of [name] that first started about 9 months ago and is progressively worsening with time. He denies any MOI or radicular symptoms. He reports symptoms usually bother him the most in the morning the day after doing a lot of yardwork/walking.  PERTINENT HISTORY: DM type 2, Dysrhythmias (SVT) PAIN:  Are you having pain? Yes: NPRS scale: 0/10 (now), 8-9/10 (worst) Pain location: L posterior hip Pain description: sharp Aggravating factors: Walking long distances, driving  for 2-3+ hours Relieving factors: Tylenol (marginal relief)  PRECAUTIONS: None  RED FLAGS: None   WEIGHT BEARING RESTRICTIONS: No  FALLS:  Has patient fallen in last 6 months? No  LIVING ENVIRONMENT: Lives with: lives with their spouse Lives in: House/apartment Stairs: Yes: Internal: 9 steps; and External: 5 steps  OCCUPATION: Administrator, arts for [employer]  PLOF: Independent  PATIENT GOALS: Be able to walk without pain  NEXT MD VISIT: TBD  OBJECTIVE:   DIAGNOSTIC FINDINGS: N/A  PATIENT SURVEYS:  FOTO initial 38/ goal 55  COGNITION: Overall cognitive status:  Within functional limits for tasks assessed     SENSATION: WFL  MUSCLE LENGTH: Distal Hamstrings: Right 150 deg; Left 139 deg Thomas test: neutral bilaterally  POSTURE: No Significant postural limitations  PALPATION: TP at L glute max origin point, TTP  LOWER EXTREMITY ROM:  Active ROM Right eval Left eval  Hip flexion 90 100  Hip extension 10 12  Hip abduction 53 34  Hip adduction    Hip internal rotation 29 27  Hip external rotation 40 33  Knee flexion [facility] WFL  Knee extension [facility]  Ankle dorsiflexion [facility] WFL  Ankle plantarflexion    Ankle inversion    Ankle eversion     (Blank rows = not tested)  LOWER EXTREMITY MMT:  MMT Right eval Left eval  Hip flexion 4+ 4+  Hip extension 4- 4-  Hip abduction 4 4  Hip adduction    Hip internal rotation 5 5 !  Hip external rotation 4+ 4+  Knee flexion 5 5  Knee extension 5 5  Ankle dorsiflexion 5 5  Ankle plantarflexion    Ankle inversion    Ankle eversion     (Blank rows = not tested)  LOWER EXTREMITY SPECIAL TESTS:  Hip special tests: Luisa Hart (FABER) test: negative, Trendelenburg test: negative, Thomas test: negative, and Piriformis test: negative, FADIR test: negative  FUNCTIONAL TESTS:  6 minute walk test: TBD  GAIT: Distance walked: 100 feet Assistive device utilized: None Level of assistance: Complete Independence Comments: Normalized gait pattern, WFL.   TODAY'S TREATMENT:                                                                                                                              DATE: 11/24/2022    Subjective: Pt reports severe [name] after long road-trip last week; 0/10 pain in L hip right now. Reports he has been able to do some stretches at home but not much else.   Manual tx.: Supine generalized LLE stretching: hamstrings, glutes, piriformis x 12 minutes  Contract-relax stretch for L hip external rotators in supine: x5 reps  TP release to L glute in sidelying with added  hip flexion: 2x10  Therex: Bridge with clamshells: 2x10, blue TB - no reports of [name], just muscle fatigue  3-way hip with blue TB around knees: 1x8 each direction, both legs - no pain  PATIENT EDUCATION:   Education details: Self-release of TP, use of ice/heat at home to manage pain/symptoms Person educated: Patient Education method: Medical illustrator Education comprehension: verbalized understanding and returned demonstration  HOME EXERCISE PROGRAM: Access Code: [email or web address] URL: [email or web address] Date: 11/24/2022 Prepared by: Dorene Grebe Exercises - Supine Figure 4 Piriformis Stretch - 1 x daily - 7 x weekly - 3 sets - 10 reps - Seated Piriformis Stretch with Trunk Bend - 1 x daily - 7 x weekly - 3 sets - 10 reps - Sit to Stand with Resistance Around Legs - 1 x daily - 4 x weekly - 3 sets - 10 reps - Bridge with Hip Abduction and Resistance - 1 x daily - 4 x weekly - 3 sets - 10 reps - Standing 3-Way Leg Reach with Resistance at Ankles and Counter Support - 1 x daily - 4 x weekly - 3 sets - 10 reps   ASSESSMENT:  CLINICAL IMPRESSION: Pt presents to PT with no pain, but reports having significantly increased symptoms last week after a long road trip. Session today consisted of briefly reviewing HEP, manual techniques/STM to treat trigger point in L glute/piriformis and improve LE flexibility, as well as exercises to improve hip and core strength. Pt educated to increase stretching frequency at home and to try and perform self-release techniques on TP. Pt will benefit from skilled PT services to address the above impairments and get back to walking/yard work with no pain.    OBJECTIVE IMPAIRMENTS: decreased activity tolerance, decreased ROM, and decreased strength.   ACTIVITY LIMITATIONS:  Walking/driving long distances  PARTICIPATION LIMITATIONS: driving and yard work  PERSONAL FACTORS: Time since onset of injury/illness/exacerbation and 1-2  comorbidities: DM T2, dysrhythmias (SVT)  are also affecting patient's functional outcome.   REHAB POTENTIAL: Good  CLINICAL DECISION MAKING: Stable/uncomplicated  EVALUATION COMPLEXITY: Low   GOALS:  SHORT TERM GOALS: Target date: 12/06/2022  1.  Pt independent with HEP to increase L hip flexion >110 deg. To improve  independence with self-management of pain/symptoms. Baseline: see above Goal status: INITIAL   LONG TERM GOALS: Target date: 12/27/2022  1.  Pt will improve FOTO score to 57 in order to show improvement of pain and performance of ADLs. Baseline:  38 Goal status: INITIAL  2.  Pt will increase all hip MMT scores to at least 4+/5 MMT with no pain in order to allow for improved ADL/IADL performance. Baseline: Extension (4-) bilaterally, abduction (4) bilaterally, L IR (5) but painful Goal status: INITIAL  3.  Pt will report being able to walk > 30 minutes with no hip symptoms to be able to get back to regular walking routine. Baseline: Pt unable to give exact timeframe of when pain comes on but describes it as "soon." Goal status: INITIAL  4.  Pt will demonstrate ability to independently manage treatment of trigger points in order to participate in yardwork/walking with no pain. Baseline: TP release included in initial HEP, pt required demonstration and verbal cueing Goal status: INITIAL  5.  Pt will be able to perform squat with no knee valgus in order to demonstrate improved hip muscle strength and bending/lifting tasks. Baseline: Bilateral knee valgus present with squat Goal status: INITIAL   PLAN:  PT FREQUENCY: 2x/week  PT DURATION: 6 weeks  PLANNED INTERVENTIONS: Therapeutic exercises, Therapeutic activity, Neuromuscular re-education, Patient/Family education, Self Care, Joint mobilization, Dry Needling, Cryotherapy, and Moist heat  PLAN FOR NEXT SESSION: Progress hip/core strengthening exercises, trial resisted gait, STM to L  hip/glute musculature, dry  needling  Cammie Mcgee, PT, DPT # [phone] Cena Benton, SPT 11/24/22, 5:36 PM

## 2022-11-29 ENCOUNTER — Ambulatory Visit: Payer: BLUE CROSS/BLUE SHIELD | Admitting: Physical Therapy

## 2022-12-01 ENCOUNTER — Encounter: Payer: Commercial Managed Care - PPO | Admitting: Physical Therapy

## 2022-12-03 ENCOUNTER — Ambulatory Visit: Payer: BLUE CROSS/BLUE SHIELD | Admitting: Physical Therapy

## 2022-12-06 ENCOUNTER — Ambulatory Visit: Payer: BLUE CROSS/BLUE SHIELD | Admitting: Physical Therapy

## 2022-12-08 ENCOUNTER — Ambulatory Visit: Payer: BLUE CROSS/BLUE SHIELD | Admitting: Physical Therapy

## 2022-12-15 ENCOUNTER — Encounter: Payer: Commercial Managed Care - PPO | Admitting: Physical Therapy

## 2022-12-20 ENCOUNTER — Encounter: Payer: Commercial Managed Care - PPO | Admitting: Physical Therapy

## 2022-12-22 ENCOUNTER — Encounter: Payer: Commercial Managed Care - PPO | Admitting: Physical Therapy

## 2023-06-08 ENCOUNTER — Other Ambulatory Visit: Payer: Self-pay | Admitting: Family Medicine

## 2023-06-08 DIAGNOSIS — R0789 Other chest pain: Secondary | ICD-10-CM

## 2023-06-23 ENCOUNTER — Ambulatory Visit
Admission: RE | Admit: 2023-06-23 | Discharge: 2023-06-23 | Disposition: A | Discharge status: Home / Self Care | Payer: Self-pay | Source: Ambulatory Visit | Attending: Family Medicine | Admitting: Family Medicine

## 2023-06-23 DIAGNOSIS — R0789 Other chest pain: Secondary | ICD-10-CM | POA: Insufficient documentation

## 2023-07-04 ENCOUNTER — Encounter: Payer: Self-pay | Admitting: Cardiology

## 2023-07-04 ENCOUNTER — Ambulatory Visit: Attending: Cardiology | Admitting: Cardiology

## 2023-07-04 VITALS — BP 128/82 | HR 69 | Ht 69.0 in | Wt 164.6 lb

## 2023-07-04 DIAGNOSIS — E7800 Pure hypercholesterolemia, unspecified: Secondary | ICD-10-CM

## 2023-07-04 DIAGNOSIS — R0609 Other forms of dyspnea: Secondary | ICD-10-CM | POA: Diagnosis not present

## 2023-07-04 DIAGNOSIS — R072 Precordial pain: Secondary | ICD-10-CM | POA: Diagnosis not present

## 2023-07-04 MED ORDER — METOPROLOL TARTRATE 100 MG PO TABS: 100.0000 mg | ORAL_TABLET | Freq: Once | ORAL | 0 refills | Status: AC

## 2023-07-04 NOTE — Patient Instructions (Signed)
 Medication Instructions:  Your physician recommends that you continue on your current medications as directed. Please refer to the Current Medication list given to you today.  *If you need a refill on your cardiac medications before your next appointment, please call your pharmacy*   Lab Work: Your physician recommends that you return for lab work in:   Labs today: BMP, Lipid  If you have labs (blood work) drawn today and your tests are completely normal, you will receive your results only by: MyChart Message (if you have MyChart) OR A paper copy in the mail If you have any lab test that is abnormal or we need to change your treatment, we will call you to review the results.   Testing/Procedures: Your physician has requested that you have an echocardiogram. Echocardiography is a painless test that uses sound waves to create images of your heart. It provides your doctor with information about the size and shape of your heart and how well your heart's chambers and valves are working. This procedure takes approximately one hour. There are no restrictions for this procedure. Please do NOT wear cologne, perfume, aftershave, or lotions (deodorant is allowed). Please arrive 15 minutes prior to your appointment time.  Please note: We ask at that you not bring children with you during ultrasound (echo/ vascular) testing. Due to room size and safety concerns, children are not allowed in the ultrasound rooms during exams. Our front office staff cannot provide observation of children in our lobby area while testing is being conducted. An adult accompanying a patient to their appointment will only be allowed in the ultrasound room at the discretion of the ultrasound technician under special circumstances. We apologize for any inconvenience.    Your cardiac CT will be scheduled at one of the below locations:   [facility] [address] [place], [place] [address] [phone]  OR  [facility] [address] [place], [place] [address] [phone]  OR   [facility] [address] [place], [place] [address] [phone]  OR   [facility] [address] [place], [place] [address] [phone]  If scheduled at [facility], please arrive at the [facility] and Children's Entrance (Entrance C2) of [facility] 30 minutes prior to test start time. You can use the FREE valet parking offered at entrance C (encouraged to control the heart rate for the test)  Proceed to the [facility] Radiology Department (first floor) to check-in and test prep.  All radiology patients and guests should use entrance C2 at [facility], accessed from [facility], even though the hospital's physical address listed is [address]    If scheduled at [facility] or [facility], please arrive 15 mins early for check-in and test prep.  There is spacious parking and easy access to the radiology department from the [facility] Heart and Vascular entrance. Please enter here and check-in with the desk attendant.   If scheduled at [facility], please arrive 30 minutes early for check-in and test prep.  Please follow these instructions carefully (unless otherwise directed):  An IV will be required for this test and Nitroglycerin will be given.  Hold all erectile dysfunction medications at least 3 days (72 hrs) prior to test. (Ie viagra, cialis, sildenafil, tadalafil, etc)   On the Night Before the Test: Be sure to Drink plenty of water. Do not  consume any caffeinated/decaffeinated beverages or chocolate 12 hours prior to your test. Do not take any antihistamines 12 hours prior to your test.  On the Day of the Test: Drink plenty of water until 1 hour prior to the test. Do not eat any food 1 hour  prior to test. You may take your regular medications prior to the test.  Take metoprolol (Lopressor) two hours prior to test. Patients who wear a continuous glucose monitor MUST remove the device prior to scanning.      After the Test: Drink plenty of water. After receiving IV contrast, you may experience a mild flushed feeling. This is normal. On occasion, you may experience a mild rash up to 24 hours after the test. This is not dangerous. If this occurs, you can take Benadryl 25 mg, Zyrtec, Claritin, or Allegra and increase your fluid intake. (Patients taking Tikosyn should avoid Benadryl, and may take Zyrtec, Claritin, or Allegra) If you experience trouble breathing, this can be serious. If it is severe call 911 IMMEDIATELY. If it is mild, please call our office.  We will call to schedule your test 2-4 weeks out understanding that some insurance companies will need an authorization prior to the service being performed.   For more information and frequently asked questions, please visit our website : [email or web address]  For non-scheduling related questions, please contact the cardiac imaging nurse navigator should you have any questions/concerns: Cardiac Imaging Nurse Navigators Direct Office Dial: [phone]   For scheduling needs, including cancellations and rescheduling, please call [place], [phone].    Follow-Up: At [facility], you and your health needs are our priority.  As part of our continuing mission to provide you with exceptional heart care, we have created designated Provider Care Teams.  These Care Teams include your primary Cardiologist (physician) and Advanced Practice Providers (APPs -  Physician Assistants and Nurse Practitioners) who all work together to provide you with the care you need, when you need it.  We recommend signing up for the patient portal called "MyChart".  Sign up information is provided on this After Visit Summary.   MyChart is used to connect with patients for Virtual Visits (Telemedicine).  Patients are able to view lab/test results, encounter notes, upcoming appointments, etc.  Non-urgent messages can be sent to your provider as well.   To learn more about what you can do with MyChart, go to [email or web address].    Your next appointment:   2 month(s)  Provider:   Dr. Myriam Forehand - Sandie Ano  Other Instructions None

## 2023-07-04 NOTE — Progress Notes (Signed)
 Cardiology Office Note:    Date:  07/04/2023   ID:  Ermine Spofford, DOB 07/29/1955, MRN [id number]  PCP:  Lupita Raider, MD   [facility] Providers Cardiologist:  None     Referring MD: Lupita Raider, MD   No chief complaint on file.  Geoffery Aultman is a 68 y.o. male who is being seen today for the evaluation of chest pain at the request of Lupita Raider, MD.   History of Present Illness:    Juanantonio Stolar is a 68 y.o. male with a hx of CAD(LAD, LCx calcification), hyperlipidemia who presents due to dyspnea on exertion and chest pain.  Endorse having shortness of breath with exertion over the past several months.  Usual activities such as mowing his lawn causes shortness of breath.  Had an episode of chest discomfort a month ago due to stress from work.  Patient had a coronary calcium score 06/2023 value 179, 60th percentile.  Compliant with aspirin, statin as prescribed.  Last cholesterol level at PCPs office was adequately controlled.  Prior notes/testing CCTA 10/2018 calcium score 94, minimal LAD, mild LCx stenosis.  Past Medical History:  Diagnosis Date   Allergic rhinitis due to pollen    Allergy    Anxiety, generalized    Arthritis    Barrett's esophagus    Cholelithiasis    Diarrhea    Diverticulosis    Diverticulosis    DM    type 2   Dysrhythmia    SVT   ED (erectile dysfunction)    Fatty liver    GASTRITIS    GERD    Hepatic steatosis    Hiatal hernia    History of hiatal hernia    Insomnia    Irritable bowel syndrome    Mixed hyperlipidemia    Obstructive sleep apnea (adult) (pediatric)    SVT (supraventricular tachycardia) (HCC)    Tinnitus    Type 2 diabetes mellitus with other specified complication [facility])     Past Surgical History:  Procedure Laterality Date   CARPAL TUNNEL RELEASE Right    ELBOW SURGERY Right    ESOPHAGEAL MANOMETRY N/A 12/08/2016   Procedure: ESOPHAGEAL MANOMETRY (EM);  Surgeon:  Beverley Fiedler, MD;  Location: [facility] ENDOSCOPY;  Service: Gastroenterology;  Laterality: N/A;   HERNIA REPAIR     failed surgery   LAPAROSCOPIC NISSEN FUNDOPLICATION N/A 03/11/2017   Procedure: LAPAROSCOPIC REDO NISSEN FUNDOPLICATION, UPPER ENDOSCOPY;  Surgeon: Luretha Murphy, MD;  Location: [facility] ORS;  Service: General;  Laterality: N/A;   SUPRAVENTRICULAR TACHYCARDIA ABLATION N/A 10/13/2011   Procedure: SUPRAVENTRICULAR TACHYCARDIA ABLATION;  Surgeon: Marinus Maw, MD;  Location: [facility] CATH LAB;  Service: Cardiovascular;  Laterality: N/A;   THORACIC DISC SURGERY     UPPER GASTROINTESTINAL ENDOSCOPY      Current Medications: Current Meds  Medication Sig   acetaminophen (TYLENOL) 650 MG CR tablet Take 650 mg by mouth every 8 (eight) hours as needed for pain.   aspirin EC 81 MG tablet Take 81 mg by mouth daily.   buPROPion (WELLBUTRIN XL) 150 MG 24 hr tablet Take 150 mg daily by mouth.   Continuous Blood Gluc Sensor (FREESTYLE LIBRE 14 DAY SENSOR) MISC See admin instructions.   Empagliflozin-linaGLIPtin 25-5 MG TABS Take 1 tablet by mouth daily.   glipiZIDE (GLUCOTROL) 10 MG tablet Take 10 mg by mouth 2 (two) times daily before a meal.   lisinopril (PRINIVIL,ZESTRIL) 5 MG tablet Take 5 mg by mouth daily.   loratadine (  CLARITIN) 10 MG tablet Take 10 mg by mouth daily.   metFORMIN (GLUCOPHAGE) 500 MG tablet Take 500-1,000 mg See admin instructions by mouth. Take 1000 mg in the morning and 500 mg in the evening   metoprolol tartrate (LOPRESSOR) 100 MG tablet Take 1 tablet (100 mg total) by mouth once for 1 dose. Please take this medication 2 hours before CT.   Multiple Vitamin (MULTIVITAMIN) capsule Take 1 capsule by mouth daily.   omeprazole (PRILOSEC) 20 MG capsule Take 1 capsule (20 mg total) by mouth daily.   simvastatin (ZOCOR) 80 MG tablet Take 40 mg by mouth at bedtime.   sodium chloride (OCEAN) 0.65 % SOLN nasal spray Place 2 sprays into both nostrils as needed for congestion.   Testosterone  40.5 MG/2.5GM (1.62%) GEL 1 packet to skin in the morning to shoulder and upper arms   Current Facility-Administered Medications for the 07/04/23 encounter (Office Visit) with Debbe Odea, MD  Medication   0.9 %  sodium chloride infusion     Allergies:   Effexor [venlafaxine], Metformin and related, and Sudafed [pseudoephedrine hcl]   Social History   Socioeconomic History   Marital status: Married    Spouse name: Not on file   Number of children: Not on file   Years of education: Not on file   Highest education level: Not on file  Occupational History   Not on file  Tobacco Use   Smoking status: Never   Smokeless tobacco: Never  Vaping Use   Vaping status: Never Used  Substance and Sexual Activity   Alcohol use: Yes    Comment: DAILY- 2 glasses of wine   Drug use: No   Sexual activity: Yes  Other Topics Concern   Not on file  Social History Narrative   Not on file   Social Drivers of Health   Financial Resource Strain: Not on file  Food Insecurity: Not on file  Transportation Needs: Not on file  Physical Activity: Not on file  Stress: Not on file  Social Connections: Not on file     Family History: The patient's family history includes Diabetes in his father; Heart failure in his mother; Lung cancer in his father. There is no history of Colon cancer, Esophageal cancer, Stomach cancer, Rectal cancer, or Colon polyps.  ROS:   Please see the history of present illness.     All other systems reviewed and are negative.  EKGs/Labs/Other Studies Reviewed:    The following studies were reviewed today:  EKG Interpretation Date/Time:  Monday July 04 2023 10:39:22 EDT Ventricular Rate:  69 PR Interval:  164 QRS Duration:  84 QT Interval:  410 QTC Calculation: 439 R Axis:   9  Text Interpretation: Normal sinus rhythm Normal ECG Confirmed by Debbe Odea ([id number]) on 07/04/2023 11:06:49 AM    Recent Labs: No results found for requested labs within last  365 days.  Recent Lipid Panel No results found for: "CHOL", "TRIG", "HDL", "CHOLHDL", "VLDL", "LDLCALC", "LDLDIRECT"   Risk Assessment/Calculations:             Physical Exam:    VS:  BP 128/82   Pulse 69   Ht 5\' 9"  (1.753 m)   Wt 164 lb 9.6 oz (74.7 kg)   SpO2 97%   BMI 24.31 kg/m     Wt Readings from Last 3 Encounters:  07/04/23 164 lb 9.6 oz (74.7 kg)  11/10/22 163 lb (73.9 kg)  03/13/21 162 lb (73.5 kg)  GEN:  Well nourished, well developed in no acute distress HEENT: Normal NECK: No JVD; No carotid bruits CARDIAC: RRR, no murmurs, rubs, gallops RESPIRATORY:  Clear to auscultation without rales, wheezing or rhonchi  ABDOMEN: Soft, non-tender, non-distended MUSCULOSKELETAL:  No edema; No deformity  SKIN: Warm and dry NEUROLOGIC:  Alert and oriented x 3 PSYCHIATRIC:  Normal affect   ASSESSMENT:    1. Precordial pain   2. Dyspnea on exertion   3. Pure hypercholesterolemia    PLAN:    In order of problems listed above:  Chest pain, dyspnea on exertion.  LAD, LCx coronary calcifications.  Get echocardiogram, get coronary CTA to evaluate obstructive CAD. Hyperlipidemia, continue simvastatin.  Obtain lipid panel from PCPs office.  Follow-up after cardiac testing.      Medication Adjustments/Labs and Tests Ordered: Current medicines are reviewed at length with the patient today.  Concerns regarding medicines are outlined above.  Orders Placed This Encounter  Procedures   CT CORONARY MORPH W/CTA COR W/SCORE W/CA W/CM &/OR WO/CM   Basic Metabolic Panel (BMET)   Lipid Profile   EKG 12-Lead   ECHOCARDIOGRAM COMPLETE   Meds ordered this encounter  Medications   metoprolol tartrate (LOPRESSOR) 100 MG tablet    Sig: Take 1 tablet (100 mg total) by mouth once for 1 dose. Please take this medication 2 hours before CT.    Dispense:  1 tablet    Refill:  0    Patient Instructions  Medication Instructions:  Your physician recommends that you continue on  your current medications as directed. Please refer to the Current Medication list given to you today.  *If you need a refill on your cardiac medications before your next appointment, please call your pharmacy*   Lab Work: Your physician recommends that you return for lab work in:   Labs today: BMP, Lipid  If you have labs (blood work) drawn today and your tests are completely normal, you will receive your results only by: MyChart Message (if you have MyChart) OR A paper copy in the mail If you have any lab test that is abnormal or we need to change your treatment, we will call you to review the results.   Testing/Procedures: Your physician has requested that you have an echocardiogram. Echocardiography is a painless test that uses sound waves to create images of your heart. It provides your doctor with information about the size and shape of your heart and how well your heart's chambers and valves are working. This procedure takes approximately one hour. There are no restrictions for this procedure. Please do NOT wear cologne, perfume, aftershave, or lotions (deodorant is allowed). Please arrive 15 minutes prior to your appointment time.  Please note: We ask at that you not bring children with you during ultrasound (echo/ vascular) testing. Due to room size and safety concerns, children are not allowed in the ultrasound rooms during exams. Our front office staff cannot provide observation of children in our lobby area while testing is being conducted. An adult accompanying a patient to their appointment will only be allowed in the ultrasound room at the discretion of the ultrasound technician under special circumstances. We apologize for any inconvenience.    Your cardiac CT will be scheduled at one of the below locations:   [facility] [address] [place], [place] [address] [phone]  OR  [facility] [address] [place], [place] [address] [phone]  OR   [facility] [address] [place], [place] [address] [phone]  OR   [facility] [address] [place], [place] [address] [phone]  If scheduled at [facility], please arrive at the [facility] and Children's Entrance (Entrance C2) of [facility] 30 minutes prior to test start time. You can use the FREE valet parking offered at entrance C (encouraged to control the heart rate for the test)  Proceed to the [facility] Radiology Department (first floor) to check-in and test prep.  All radiology patients and guests should use entrance C2 at [facility], accessed from [facility], even though the hospital's physical address listed is [address].    If scheduled at [facility] or [facility], please arrive 15 mins early for check-in and test prep.  There is spacious parking and easy access to the radiology department from the [facility] entrance. Please enter here and check-in with the desk attendant.   If scheduled at [facility], please arrive 30 minutes early for check-in and test prep.  Please follow these instructions carefully (unless otherwise directed):  An IV will be required for this test and Nitroglycerin will be given.  Hold all erectile dysfunction medications at least 3 days (72 hrs) prior to test. (Ie viagra, cialis, sildenafil, tadalafil, etc)   On the Night Before the Test: Be sure to Drink plenty of water. Do not consume any caffeinated/decaffeinated beverages or chocolate 12 hours prior to your test. Do not take any antihistamines 12 hours prior to your test.  On the Day of the Test: Drink plenty of water until 1 hour prior to the test. Do not eat any food 1 hour prior to test. You may take your regular medications prior to the test.  Take metoprolol  (Lopressor) two hours prior to test. Patients who wear a continuous glucose monitor MUST remove the device prior to scanning.      After the Test: Drink plenty of water. After receiving IV contrast, you may experience a mild flushed feeling. This is normal. On occasion, you may experience a mild rash up to 24 hours after the test. This is not dangerous. If this occurs, you can take Benadryl 25 mg, Zyrtec, Claritin, or Allegra and increase your fluid intake. (Patients taking Tikosyn should avoid Benadryl, and may take Zyrtec, Claritin, or Allegra) If you experience trouble breathing, this can be serious. If it is severe call 911 IMMEDIATELY. If it is mild, please call our office.  We will call to schedule your test 2-4 weeks out understanding that some insurance companies will need an authorization prior to the service being performed.   For more information and frequently asked questions, please visit our website : [email or web address]  For non-scheduling related questions, please contact the cardiac imaging nurse navigator should you have any questions/concerns: Cardiac Imaging Nurse Navigators Direct Office Dial: [phone]   For scheduling needs, including cancellations and rescheduling, please call [place], [phone].    Follow-Up: At [facility], you and your health needs are our priority.  As part of our continuing mission to provide you with exceptional heart care, we have created designated Provider Care Teams.  These Care Teams include your primary Cardiologist (physician) and Advanced Practice Providers (APPs -  Physician Assistants and Nurse Practitioners) who all work together to provide you with the care you need, when you need it.  We recommend signing up for the patient  portal called "MyChart".  Sign up information is provided on this After Visit Summary.  MyChart is used to connect with patients for Virtual Visits (Telemedicine).  Patients are able  to view lab/test results, encounter notes, upcoming appointments, etc.  Non-urgent messages can be sent to your provider as well.   To learn more about what you can do with MyChart, go to [email or web address].    Your next appointment:   2 month(s)  Provider:   Dr. Myriam Forehand - [place]o  Other Instructions None       Signed, Debbe Odea, MD  07/04/2023 12:22 PM    [facility]

## 2023-07-05 LAB — BASIC METABOLIC PANEL
BUN/Creatinine Ratio: 17 (ref 10–24)
BUN: 17 mg/dL (ref 8–27)
CO2: 23 mmol/L (ref 20–29)
Calcium: 8.9 mg/dL (ref 8.6–10.2)
Chloride: 102 mmol/L (ref 96–106)
Creatinine, Ser: 1.02 mg/dL (ref 0.76–1.27)
Glucose: 104 mg/dL — ABNORMAL HIGH (ref 70–99)
Potassium: 4.3 mmol/L (ref 3.5–5.2)
Sodium: 140 mmol/L (ref 134–144)
eGFR: 81 mL/min/{1.73_m2} (ref >59)

## 2023-07-05 LAB — LIPID PANEL
Chol/HDL Ratio: 2.4 ratio (ref 0.0–5.0)
Cholesterol, Total: 123 mg/dL (ref 100–199)
HDL: 51 mg/dL (ref >39)
LDL Chol Calc (NIH): 58 mg/dL (ref 0–99)
Triglycerides: 68 mg/dL (ref 0–149)
VLDL Cholesterol Cal: 14 mg/dL (ref 5–40)

## 2023-07-12 ENCOUNTER — Encounter (HOSPITAL_COMMUNITY): Payer: Self-pay

## 2023-07-13 ENCOUNTER — Telehealth (HOSPITAL_COMMUNITY): Payer: Self-pay | Admitting: *Deleted

## 2023-07-13 NOTE — Telephone Encounter (Signed)
 Attempted to call patient regarding upcoming cardiac CT appointment. Left message on voicemail with name and callback number [name] RN Navigator Cardiac Imaging [facility] [phone] Office

## 2023-07-14 ENCOUNTER — Ambulatory Visit
Admission: RE | Admit: 2023-07-14 | Discharge: 2023-07-14 | Disposition: A | Discharge status: Home / Self Care | Source: Ambulatory Visit | Attending: Cardiology | Admitting: Cardiology

## 2023-07-14 DIAGNOSIS — R072 Precordial pain: Secondary | ICD-10-CM

## 2023-07-14 MED ORDER — IOHEXOL 350 MG/ML SOLN
75.0000 mL | Freq: Once | INTRAVENOUS | Status: AC | PRN
  Administered 2023-07-14: 75 mL via INTRAVENOUS

## 2023-07-14 MED ORDER — METOPROLOL TARTRATE 5 MG/5ML IV SOLN
10.0000 mg | Freq: Once | INTRAVENOUS | Status: AC | PRN
  Administered 2023-07-14: 10 mg via INTRAVENOUS

## 2023-07-14 MED ORDER — SODIUM CHLORIDE 0.9 % IV SOLN: INTRAVENOUS | Status: DC

## 2023-07-14 MED ORDER — DILTIAZEM HCL 25 MG/5ML IV SOLN
10.0000 mg | INTRAVENOUS | Status: DC | PRN
  Administered 2023-07-14: 5 mg via INTRAVENOUS

## 2023-07-14 MED ORDER — NITROGLYCERIN 0.4 MG SL SUBL
0.8000 mg | SUBLINGUAL_TABLET | Freq: Once | SUBLINGUAL | Status: AC
  Administered 2023-07-14: 0.8 mg via SUBLINGUAL

## 2023-07-14 NOTE — Progress Notes (Signed)
 Patient tolerated procedure well. Ambulate w/o difficulty. Denies light headedness or being dizzy. Sitting up drinking water provided. Encouraged to drink extra water today and reasoning explained. Verbalized understanding. All questions answered. ABC intact. No further needs. Discharge from procedure area w/o issues.

## 2023-07-18 ENCOUNTER — Encounter: Payer: Self-pay | Admitting: *Deleted

## 2023-07-27 ENCOUNTER — Ambulatory Visit: Attending: Cardiology

## 2023-07-27 DIAGNOSIS — R0609 Other forms of dyspnea: Secondary | ICD-10-CM | POA: Diagnosis not present

## 2023-07-27 DIAGNOSIS — R072 Precordial pain: Secondary | ICD-10-CM

## 2023-07-27 DIAGNOSIS — E7800 Pure hypercholesterolemia, unspecified: Secondary | ICD-10-CM

## 2023-07-27 LAB — ECHOCARDIOGRAM COMPLETE
AR max vel: 2.54 cm2
AV Area VTI: 2.77 cm2
AV Area mean vel: 2.49 cm2
AV Mean grad: 4 mmHg
AV Peak grad: 7.4 mmHg
Ao pk vel: 1.36 m/s
Area-P 1/2: 4.15 cm2

## 2023-07-28 NOTE — Progress Notes (Signed)
 Cardiology Clinic Note   Date: 08/01/2023 ID: Alexander, Chambers 07-29-1955, MRN [id number]  Primary Cardiologist:  Constancia Delton, MD  Chief Complaint   Alexander Chambers is a 68 y.o. male who presents to the clinic today for follow up after testing.   Patient Profile   Alexander Chambers is followed by Dr. Junnie Olives for the history outlined below.      Past medical history significant for: Nonobstructive CAD.  Coronary CTA 07/14/2023: Coronary calcium  score 204 (63rd percentile). Mild LCx and LAD stenosis.  Echo 07/27/2023: EF 55 to 60%.  Distal anteroseptal/apical hypokinesis.  Grade I DD.  Normal RV size/function.  Mild MR.  Aortic valve sclerosis without stenosis.  SVT SVT ablation 10/13/2011.  Hypertension.  Hyperlipidemia.  Lipid panel 07/04/2023: LDL 58, HDL 51, TG 68, total 123.  GERD.  T2DM.   In summary, patient has a history of SVT and underwent ablation in July 2013. He as seen by Dr. Felipe Horton in June 2020 with complaints of intermittent chest pain. He underwent coronary CTA which demonstrated a calcium  score of 94 with mild nonobstructive CAD.   Patient was first evaluated by Dr. Junnie Olives on 07/04/2023 for chest pain and DOE at the request of [name], MD. He had undergone CT calcium  scoring ordered by PCP showed a score of 179. He underwent echo and coronary CTA as detailed above.      History of Present Illness    Today, patient reports continued fatigue and dyspnea with heavy exertion. He lives on a large plot of land and does heavy labor to maintain the property. Since the Fall he notes feeling fatigued in the middle of the day "like I want to take a nap." He is unsure if this has been a gradual progression for him. Some days he does rest and then returns to his activities and other days he pushes through to complete his tasks. He admits to having poor eating habits secondary to decreased appetite. This is not new for him. He does  experience low blood sugars when he does not eat. Yesterday blood sugar was down in the 40s with associated fatigue, shakiness and diaphoresis. He also endorses poor sleep hygiene. He was told years ago he has sleep apnea but has never worn CPAP secondary to being a very restless sleeper. His wife tells him he snores and stops breathing in his sleep. Discussed testing at length. All questions answered.     ROS: All other systems reviewed and are otherwise negative except as noted in History of Present Illness.  EKGs/Labs Reviewed        07/04/2023: BUN 17; Creatinine, Ser 1.02; Potassium 4.3; Sodium 140   Risk Assessment/Calculations          STOP-Bang Score:  6       Physical Exam    VS:  BP 130/80   Pulse 78   Ht 5\' 9"  (1.753 m)   Wt 162 lb 3.2 oz (73.6 kg)   SpO2 97%   BMI 23.95 kg/m  , BMI Body mass index is 23.95 kg/m.  GEN: Well nourished, well developed, in no acute distress. Neck: No JVD or carotid bruits. Cardiac:  RRR. No murmurs. No rubs or gallops.   Respiratory:  Respirations regular and unlabored. Clear to auscultation without rales, wheezing or rhonchi. GI: Soft, nontender, nondistended. Extremities: Radials/DP/PT 2+ and equal bilaterally. No clubbing or cyanosis. No edema.  Skin: Warm and dry, no rash. Neuro: Strength intact.  Assessment & Plan  Nonobstructive CAD/dyspnea/fatigue Coronary CTA April 2025 demonstrated calcium  score of 204 with mild LCx and LAD stenoses. Echo demonstrated normal LV/RV function with grade I DD, mild MR, aortic sclerosis without stenosis. Patient reports continued fatigue and dyspnea with heavy exertion. He does heavy labor on his large property and since the Fall has noted mid-day fatigue and associated dyspnea. He does report poor eating habits secondary to decreased appetite that is not new for him with associated low blood sugar. He also reports poor sleep hygiene (see below).  -TSH today.  -Continue aspirin , simvastatin.    SVT S/p SVT ablation July 2013. Patient denies sustained palpitations. He reports when he is feeling fatigued and dyspneic he will feel his heart beating hard. RRR on exam today.  -Continue to monitor. Further testing not indicated at this time.   Hypertension BP today 130/80. No reports headaches or dizziness.  -Continue lisinopril .   Hyperlipidemia LDL 58 March 2025, at goal.  -Continue simvastatin.   Sleep disordered breathing Stop Bang score 6. Patient reports daytime somnolence. He was diagnosed with sleep apnea years ago but never wore a CPAP secondary to being a very restless sleeper. His wife tells him he snores and stops breathing in his sleep. He agrees to referral to pulmonology.  -Refer to pulmonology for possible sleep study.   Disposition: TSH today. Referral to pulmonology. Return in 1 year or sooner as needed.          Signed, [name]. Aston Lawhorn, DNP, NP-C

## 2023-08-01 ENCOUNTER — Ambulatory Visit: Attending: Student | Admitting: Student

## 2023-08-01 ENCOUNTER — Encounter: Payer: Self-pay | Admitting: Student

## 2023-08-01 VITALS — BP 130/80 | HR 78 | Ht 69.0 in | Wt 162.2 lb

## 2023-08-01 DIAGNOSIS — I10 Essential (primary) hypertension: Secondary | ICD-10-CM

## 2023-08-01 DIAGNOSIS — E785 Hyperlipidemia, unspecified: Secondary | ICD-10-CM

## 2023-08-01 DIAGNOSIS — I2510 Atherosclerotic heart disease of native coronary artery without angina pectoris: Secondary | ICD-10-CM | POA: Diagnosis not present

## 2023-08-01 DIAGNOSIS — R5383 Other fatigue: Secondary | ICD-10-CM

## 2023-08-01 DIAGNOSIS — I471 Supraventricular tachycardia, unspecified: Secondary | ICD-10-CM

## 2023-08-01 DIAGNOSIS — G4730 Sleep apnea, unspecified: Secondary | ICD-10-CM | POA: Diagnosis not present

## 2023-08-01 NOTE — Patient Instructions (Addendum)
 Medication Instructions:  Your Physician recommend you continue on your current medication as directed.    *If you need a refill on your cardiac medications before your next appointment, please call your pharmacy*  Lab Work: TSH  If you have labs (blood work) drawn today and your tests are completely normal, you will receive your results only by: MyChart Message (if you have MyChart) OR A paper copy in the mail If you have any lab test that is abnormal or we need to change your treatment, we will call you to review the results.  Testing/Procedures: Referral to Pulmonary for Sleep Disorder breathing, [phone]  Follow-Up: At [facility], you and your health needs are our priority.  As part of our continuing mission to provide you with exceptional heart care, our providers are all part of one team.  This team includes your primary Cardiologist (physician) and Advanced Practice Providers or APPs (Physician Assistants and Nurse Practitioners) who all work together to provide you with the care you need, when you need it.  Your next appointment:   12 month(s)  Provider:   Morey Ar, NP

## 2023-08-02 LAB — TSH: TSH: 1.36 u[IU]/mL (ref 0.450–4.500)

## 2023-08-11 ENCOUNTER — Ambulatory Visit (INDEPENDENT_AMBULATORY_CARE_PROVIDER_SITE_OTHER): Admitting: Sleep Medicine

## 2023-08-11 ENCOUNTER — Encounter: Payer: Self-pay | Admitting: Sleep Medicine

## 2023-08-11 VITALS — BP 118/72 | HR 82 | Temp 97.3°F | Ht 69.0 in | Wt 161.6 lb

## 2023-08-11 DIAGNOSIS — I10 Essential (primary) hypertension: Secondary | ICD-10-CM

## 2023-08-11 DIAGNOSIS — G4700 Insomnia, unspecified: Secondary | ICD-10-CM

## 2023-08-11 DIAGNOSIS — F5104 Psychophysiologic insomnia: Secondary | ICD-10-CM

## 2023-08-11 DIAGNOSIS — G4733 Obstructive sleep apnea (adult) (pediatric): Secondary | ICD-10-CM | POA: Diagnosis not present

## 2023-08-11 NOTE — Progress Notes (Signed)
 Name:Alexander Chambers MRN: [id number] DOB: [birth year]   CHIEF COMPLAINT:  REASSESSMENT OF OSA   HISTORY OF PRESENT ILLNESS:  Alexander Chambers is a 68 y.o. w/ a h/o OSA, HTN, DMII, GERD and hyperlipidemia who presents for reassessment of OSA. Reports that he was initially diagnosed with OSA several years ago and was subsequently started on CPAP therapy which he used for a brief period. States that he discontinued CPAP therapy due to discomfort. Reports a 45 lb weight loss over the last several years.   Reports c/o loud snoring, witnessed apnea and excessive daytime sleepiness. Reports nocturnal awakenings due to unclear reasons and has difficulty falling back to sleep. Admits to dry mouth. Denies morning headaches, RLS symptoms, dream enactment, cataplexy, hypnagogic or hypnapompic hallucinations. Denies a family history of sleep apnea. Denies drowsy driving. Drinks 2-3 cups of coffee daily, drinks 2-3 alcoholic beverages 5 nights per week, denies tobacco or illicit drug use.   Bedtime 10:30-11 pm Sleep onset 15 mins Rise time 6:30-7 am   EPWORTH SLEEP SCORE 3     No data to display          PAST MEDICAL HISTORY :   has a past medical history of Allergic rhinitis due to pollen, Allergy, Anxiety, generalized, Arthritis, Barrett's esophagus, Cholelithiasis, Diarrhea, Diverticulosis, Diverticulosis, DM, Dysrhythmia, ED (erectile dysfunction), Fatty liver, GASTRITIS, GERD, Hepatic steatosis, Hiatal hernia, History of hiatal hernia, Insomnia, Irritable bowel syndrome, Mixed hyperlipidemia, Obstructive sleep apnea (adult) (pediatric), SVT (supraventricular tachycardia) (HCC), Tinnitus, and Type 2 diabetes mellitus with other specified complication (HCC).  has a past surgical history that includes Thoracic disc surgery; Hernia repair; Upper gastrointestinal endoscopy; supraventricular tachycardia ablation (N/A, 10/13/2011); Esophageal manometry (N/A, 12/08/2016); Elbow surgery (Right);  Carpal tunnel release (Right); and Laparoscopic Nissen fundoplication (N/A, 03/11/2017). Prior to Admission medications   Medication Sig Start Date End Date Taking? Authorizing Provider  acetaminophen  (TYLENOL ) 650 MG CR tablet Take 650 mg by mouth every 8 (eight) hours as needed for pain.   Yes [provider]  aspirin  EC 81 MG tablet Take 81 mg by mouth daily.   Yes [provider]  buPROPion (WELLBUTRIN XL) 150 MG 24 hr tablet Take 150 mg daily by mouth.   Yes [provider]  Continuous Blood Gluc Sensor (FREESTYLE LIBRE 14 DAY SENSOR) MISC See admin instructions. 07/25/19  Yes [provider]  Empagliflozin-linaGLIPtin  25-5 MG TABS Take 1 tablet by mouth daily.   Yes [provider]  glipiZIDE  (GLUCOTROL ) 10 MG tablet Take 10 mg by mouth 2 (two) times daily before a meal.   Yes [provider]  lisinopril  (PRINIVIL ,ZESTRIL ) 5 MG tablet Take 5 mg by mouth daily.   Yes [provider]  loratadine  (CLARITIN ) 10 MG tablet Take 10 mg by mouth daily.   Yes [provider]  metFORMIN  (GLUCOPHAGE ) 500 MG tablet Take 500-1,000 mg See admin instructions by mouth. Take 1000 mg in the morning and 500 mg in the evening   Yes [provider]  Multiple Vitamin (MULTIVITAMIN) capsule Take 1 capsule by mouth daily.   Yes [provider]  omeprazole  (PRILOSEC) 20 MG capsule Take 1 capsule (20 mg total) by mouth daily. 11/10/22  Yes Graciella Lavender, PA  simvastatin (ZOCOR) 80 MG tablet Take 40 mg by mouth at bedtime.   Yes [provider]  sodium chloride  (OCEAN) 0.65 % SOLN nasal spray Place 2 sprays into both nostrils as needed for congestion.   Yes [provider]  Testosterone  40.5 MG/2.5GM (1.62%) GEL 1 packet to skin in the morning to shoulder and upper arms 02/25/21  Yes [provider]  metoprolol  tartrate (LOPRESSOR ) 100 MG tablet Take 1 tablet (100 mg total) by mouth once for 1 dose.  Please take this medication 2 hours before CT. 07/04/23 08/01/23  Constancia Delton, MD   Allergies  Allergen Reactions   Effexor [Venlafaxine]     zombie   Metformin  And Related Diarrhea    Metformin  HCL    Sudafed [Pseudoephedrine Hcl]     Dizzy, insomnia    FAMILY HISTORY:  family history includes Diabetes in his father; Heart failure in his mother; Lung cancer in his father. SOCIAL HISTORY:  reports that he has never smoked. He has never used smokeless tobacco. He reports current alcohol use. He reports that he does not use drugs.   Review of Systems:  Gen:  Denies  fever, sweats, chills weight loss  HEENT: Denies blurred vision, double vision, ear pain, eye pain, hearing loss, nose bleeds, sore throat Cardiac:  No dizziness, chest pain or heaviness, chest tightness,edema, No JVD Resp:   No cough, -sputum production, -shortness of breath,-wheezing, -hemoptysis,  Gi: Denies swallowing difficulty, stomach pain, nausea or vomiting, diarrhea, constipation, bowel incontinence Gu:  Denies bladder incontinence, burning urine Ext:   Denies Joint pain, stiffness or swelling Skin: Denies  skin rash, easy bruising or bleeding or hives Endoc:  Denies polyuria, polydipsia , polyphagia or weight change Psych:   Denies depression, insomnia or hallucinations  Other:  All other systems negative  VITAL SIGNS: BP 118/72 (BP Location: Right Arm, Cuff Size: Normal)   Pulse 82   Temp (!) 97.3 F (36.3 C)   Ht 5\' 9"  (1.753 m)   Wt 161 lb 9.6 oz (73.3 kg)   SpO2 96%   BMI 23.86 kg/m    Physical Examination:   General Appearance: No distress  EYES PERRLA, EOM intact.   NECK Supple, No JVD Pulmonary: normal breath sounds, No wheezing.  CardiovascularNormal S1,S2.  No m/r/g.   Abdomen: Benign, Soft, non-tender. Skin:   warm, no rashes, no ecchymosis  Extremities: normal, no cyanosis, clubbing. Neuro:without focal findings,  speech normal  PSYCHIATRIC: Mood, affect within normal  limits.   ASSESSMENT AND PLAN  OSA Will reassess apnea with HST. Discussed the consequences of untreated sleep apnea. Advised not to drive drowsy for safety of patient and others. Will complete further evaluation with a home sleep study and follow up to review results.    HTN Stable, on current management. Following with PCP.   Insomnia Counseled patient on stimulus control and improving sleep hygiene practices.    MEDICATION ADJUSTMENTS/LABS AND TESTS ORDERED: Recommend Sleep Study   Patient  satisfied with Plan of action and management. All questions answered  Follow up to review HST results and treatment plan.   I spent a total of 46 minutes reviewing chart data, face-to-face evaluation with the patient, counseling and coordination of care as detailed above.    Kalecia Hartney, M.D.  Sleep Medicine [facility]

## 2023-08-11 NOTE — Patient Instructions (Signed)
 Alexander Chambers

## 2023-08-22 ENCOUNTER — Other Ambulatory Visit: Payer: Self-pay | Admitting: Physician Assistant

## 2023-08-29 ENCOUNTER — Encounter: Payer: Self-pay | Admitting: Cardiology

## 2023-09-06 ENCOUNTER — Ambulatory Visit: Admitting: Cardiology

## 2023-09-23 ENCOUNTER — Encounter

## 2023-09-23 DIAGNOSIS — G4733 Obstructive sleep apnea (adult) (pediatric): Secondary | ICD-10-CM

## 2023-09-26 DIAGNOSIS — G4733 Obstructive sleep apnea (adult) (pediatric): Secondary | ICD-10-CM | POA: Diagnosis not present

## 2023-09-30 ENCOUNTER — Ambulatory Visit: Payer: Self-pay

## 2023-09-30 DIAGNOSIS — G4733 Obstructive sleep apnea (adult) (pediatric): Secondary | ICD-10-CM

## 2023-10-12 NOTE — Telephone Encounter (Signed)
 The order was placed while Dr. Jess was out of the office. I had sent everything except for her signature page due to her not signing the order at that time. The referral, notes sleep study has been refaxed to [facility]

## 2023-10-13 NOTE — Telephone Encounter (Signed)
 Yes it has been sent with signed order

## 2023-10-25 ENCOUNTER — Telehealth: Payer: Self-pay | Admitting: Sleep Medicine

## 2023-10-25 NOTE — Telephone Encounter (Signed)
 Patient needs CPAP compliance between 11/22/2023 to 01/21/2024.

## 2023-11-22 ENCOUNTER — Ambulatory Visit: Admitting: Sleep Medicine

## 2023-11-22 ENCOUNTER — Encounter: Payer: Self-pay | Admitting: Sleep Medicine

## 2023-11-22 VITALS — BP 120/78 | HR 80 | Temp 98.3°F | Ht 69.0 in | Wt 163.2 lb

## 2023-11-22 DIAGNOSIS — F5104 Psychophysiologic insomnia: Secondary | ICD-10-CM

## 2023-11-22 DIAGNOSIS — G4733 Obstructive sleep apnea (adult) (pediatric): Secondary | ICD-10-CM

## 2023-11-22 DIAGNOSIS — I10 Essential (primary) hypertension: Secondary | ICD-10-CM

## 2023-11-22 MED ORDER — TRAZODONE HCL 50 MG PO TABS: ORAL_TABLET | ORAL | 3 refills | Status: AC

## 2023-11-22 NOTE — Progress Notes (Signed)
 Name:Vertis Jhovany Weidinger MRN: [id number] DOB: [birth year]   CHIEF COMPLAINT:  CPAP F/U   HISTORY OF PRESENT ILLNESS:  Mr. Williamson is a 68 y.o. w/ a h/o OSA, HTN, depression and DMII who presents for CPAP F/U visit. Reports using CPAP therapy every night, which is confirmed by compliance data. He is currently using the Airtouch N30i nasal mask, which is comfortable. Denies air leaks or nasal congestion. Reports feeling more refreshed upon awakening with CPAP therapy.    EPWORTH SLEEP SCORE    08/15/2023    9:00 AM  Results of the Epworth flowsheet  Sitting and reading 0  Watching TV 2  Sitting, inactive in a public place (e.g. a theatre or a meeting) 0  As a passenger in a car for an hour without a break 0  Lying down to rest in the afternoon when circumstances permit 3  Sitting and talking to someone 0  Sitting quietly after a lunch without alcohol 0  In a car, while stopped for a few minutes in traffic 0  Total score 5    PAST MEDICAL HISTORY :   has a past medical history of Allergic rhinitis due to pollen, Allergy, Anxiety, generalized, Arthritis, Barrett's esophagus, Cholelithiasis, Diarrhea, Diverticulosis, Diverticulosis, DM, Dysrhythmia, ED (erectile dysfunction), Fatty liver, GASTRITIS, GERD, Hepatic steatosis, Hiatal hernia, History of hiatal hernia, Insomnia, Irritable bowel syndrome, Mixed hyperlipidemia, Obstructive sleep apnea (adult) (pediatric), SVT (supraventricular tachycardia) (HCC), Tinnitus, and Type 2 diabetes mellitus with other specified complication (HCC).  has a past surgical history that includes Thoracic disc surgery; Hernia repair; Upper gastrointestinal endoscopy; supraventricular tachycardia ablation (N/A, 10/13/2011); Esophageal manometry (N/A, 12/08/2016); Elbow surgery (Right); Carpal tunnel release (Right); and Laparoscopic Nissen fundoplication (N/A, 03/11/2017). Prior to Admission medications   Medication Sig Start Date End Date Taking?  Authorizing Provider  ACCRUFER 30 MG CAPS Take 1 capsule by mouth 2 (two) times daily. 09/08/23  Yes [provider]  acetaminophen  (TYLENOL ) 650 MG CR tablet Take 650 mg by mouth every 8 (eight) hours as needed for pain.   Yes [provider]  aspirin  EC 81 MG tablet Take 81 mg by mouth daily.   Yes [provider]  buPROPion (WELLBUTRIN XL) 150 MG 24 hr tablet Take 150 mg daily by mouth.   Yes [provider]  Continuous Blood Gluc Sensor (FREESTYLE LIBRE 14 DAY SENSOR) MISC See admin instructions. 07/25/19  Yes [provider]  Empagliflozin-linaGLIPtin  25-5 MG TABS Take 1 tablet by mouth daily.   Yes [provider]  glipiZIDE  (GLUCOTROL ) 10 MG tablet Take 10 mg by mouth 2 (two) times daily before a meal.   Yes [provider]  lisinopril  (PRINIVIL ,ZESTRIL ) 5 MG tablet Take 5 mg by mouth daily.   Yes [provider]  loratadine  (CLARITIN ) 10 MG tablet Take 10 mg by mouth daily.   Yes [provider]  metFORMIN  (GLUCOPHAGE ) 500 MG tablet Take 500-1,000 mg See admin instructions by mouth. Take 1000 mg in the morning and 500 mg in the evening   Yes [provider]  metoprolol  tartrate (LOPRESSOR ) 100 MG tablet Take 1 tablet (100 mg total) by mouth once for 1 dose. Please take this medication 2 hours before CT. 07/04/23 11/22/23 Yes Agbor-Etang, Redell, MD  Multiple Vitamin (MULTIVITAMIN) capsule Take 1 capsule by mouth daily.   Yes [provider]  omeprazole  (PRILOSEC) 20 MG capsule TAKE 1 CAPSULE BY MOUTH ONCE DAILY 08/22/23  Yes Beather Delon Gibson, [place]  simvastatin (ZOCOR) 80 MG tablet Take 40 mg by mouth at bedtime.   Yes [provider]  sodium chloride  (OCEAN) 0.65 % SOLN nasal spray Place 2 sprays into both nostrils as needed for congestion.   Yes [provider]  Testosterone  40.5 MG/2.5GM (1.62%) GEL 1 packet to skin in the morning to shoulder and upper arms 02/25/21  Yes  [provider]   Allergies  Allergen Reactions   Effexor [Venlafaxine]     zombie   Metformin  And Related Diarrhea    Metformin  HCL    Sudafed [Pseudoephedrine Hcl]     Dizzy, insomnia    FAMILY HISTORY:  family history includes Diabetes in his father; Heart failure in his mother; Lung cancer in his father. SOCIAL HISTORY:  reports that he has never smoked. He has never used smokeless tobacco. He reports current alcohol use. He reports that he does not use drugs.   Review of Systems:  Gen:  Denies  fever, sweats, chills weight loss  HEENT: Denies blurred vision, double vision, ear pain, eye pain, hearing loss, nose bleeds, sore throat Cardiac:  No dizziness, chest pain or heaviness, chest tightness,edema, No JVD Resp:   No cough, -sputum production, -shortness of breath,-wheezing, -hemoptysis,  Gi: Denies swallowing difficulty, stomach pain, nausea or vomiting, diarrhea, constipation, bowel incontinence Gu:  Denies bladder incontinence, burning urine Ext:   Denies Joint pain, stiffness or swelling Skin: Denies  skin rash, easy bruising or bleeding or hives Endoc:  Denies polyuria, polydipsia , polyphagia or weight change Psych:   Denies depression, insomnia or hallucinations  Other:  All other systems negative  VITAL SIGNS: BP 120/78 (BP Location: Left Arm, Patient Position: Sitting, Cuff Size: Normal)   Pulse 80   Temp 98.3 F (36.8 C) (Oral)   Ht 5' 9 (1.753 m)   Wt 163 lb 3.2 oz (74 kg)   SpO2 98%   BMI 24.10 kg/m    Physical Examination:   General Appearance: No distress  EYES PERRLA, EOM intact.   NECK Supple, No JVD Pulmonary: normal breath sounds, No wheezing.  CardiovascularNormal S1,S2.  No m/r/g.   Abdomen: Benign, Soft, non-tender. Skin:   warm, no rashes, no ecchymosis  Extremities: normal, no cyanosis, clubbing. Neuro:without focal findings,  speech normal  PSYCHIATRIC: Mood, affect within normal limits.   ASSESSMENT AND  PLAN  OSA Patient is using and benefiting from CPAP therapy. Discussed the consequences of untreated sleep apnea. Advised not to drive drowsy for safety of patient and others. Will follow up in 3 months.    HTN Stable, on current management. Following with PCP.   Insomnia Counseled patient on stimulus control and improving sleep hygiene practices. Will also try patient on Trazodone  25 mg at bedtime. Counseled patient on side effects of Trazodone . Will start on 25 mg and increase as needed.    Patient  satisfied with Plan of action and management. All questions answered  I spent a total of 36 minutes reviewing chart data, face-to-face evaluation with the patient, counseling and coordination of care as detailed above.    Ante Arredondo, M.D.  Sleep Medicine [facility]

## 2023-11-22 NOTE — Patient Instructions (Addendum)

## 2024-04-06 ENCOUNTER — Encounter: Payer: Self-pay | Admitting: Student
